# Patient Record
Sex: Female | Born: 2000 | Race: Black or African American | Hispanic: No | Marital: Single | Smoking: Never smoker
Health system: Southern US, Community
[De-identification: ages and names within clinical notes are randomized; demographics above are authoritative.]

## PROBLEM LIST (undated history)

## (undated) DIAGNOSIS — Z789 Other specified health status: Secondary | ICD-10-CM

## (undated) HISTORY — DX: Other specified health status: Z78.9

---
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
## 2018-01-01 ENCOUNTER — Ambulatory Visit: Payer: Medicaid Other | Admitting: Pediatrics

## 2018-03-17 ENCOUNTER — Ambulatory Visit: Payer: Commercial Managed Care - PPO | Admitting: Pediatrics

## 2018-05-26 ENCOUNTER — Ambulatory Visit: Payer: Commercial Managed Care - PPO | Admitting: Pediatrics

## 2018-05-26 ENCOUNTER — Encounter: Payer: Self-pay | Admitting: Pediatrics

## 2018-05-26 ENCOUNTER — Ambulatory Visit (INDEPENDENT_AMBULATORY_CARE_PROVIDER_SITE_OTHER): Payer: Commercial Managed Care - PPO | Admitting: Pediatrics

## 2018-05-26 ENCOUNTER — Other Ambulatory Visit: Payer: Self-pay

## 2018-05-26 VITALS — Wt 103.6 lb

## 2018-05-26 DIAGNOSIS — Z113 Encounter for screening for infections with a predominantly sexual mode of transmission: Secondary | ICD-10-CM

## 2018-05-26 DIAGNOSIS — Z1389 Encounter for screening for other disorder: Secondary | ICD-10-CM

## 2018-05-26 DIAGNOSIS — B9689 Other specified bacterial agents as the cause of diseases classified elsewhere: Secondary | ICD-10-CM

## 2018-05-26 DIAGNOSIS — R399 Unspecified symptoms and signs involving the genitourinary system: Secondary | ICD-10-CM

## 2018-05-26 DIAGNOSIS — Z3202 Encounter for pregnancy test, result negative: Secondary | ICD-10-CM | POA: Diagnosis not present

## 2018-05-26 DIAGNOSIS — N760 Acute vaginitis: Secondary | ICD-10-CM | POA: Diagnosis not present

## 2018-05-26 LAB — POCT URINALYSIS DIPSTICK
BILIRUBIN UA: NEGATIVE
Glucose, UA: NEGATIVE
KETONES UA: NEGATIVE
Nitrite, UA: NEGATIVE
PH UA: 6.5 (ref 5.0–8.0)
Protein, UA: NEGATIVE
RBC UA: NEGATIVE
Spec Grav, UA: 1.015 (ref 1.010–1.025)
UROBILINOGEN UA: NEGATIVE U/dL — AB

## 2018-05-26 LAB — POCT URINE PREGNANCY: PREG TEST UR: NEGATIVE

## 2018-05-26 MED ORDER — CEFTRIAXONE SODIUM 250 MG IJ SOLR
250.0000 mg | Freq: Once | INTRAMUSCULAR | Status: AC
  Administered 2018-05-26: 250 mg via INTRAMUSCULAR

## 2018-05-26 MED ORDER — AZITHROMYCIN 500 MG PO TABS
1000.0000 mg | ORAL_TABLET | Freq: Once | ORAL | Status: AC
  Administered 2018-05-26: 1000 mg via ORAL

## 2018-05-26 MED ORDER — FLUCONAZOLE 150 MG PO TABS: 150.0000 mg | ORAL_TABLET | Freq: Once | ORAL | 1 refills | Status: AC

## 2018-05-26 NOTE — Patient Instructions (Signed)
Gonorrhea Gonorrhea is a sexually transmitted disease (STD) that can affect both men and women. If left untreated, this infection can:  Damage the female or female organs.  Cause women and men to be unable to have children (be sterile).  Harm a fetus if an infected woman is pregnant. It is important to get treatment for gonorrhea as soon as possible. It is also necessary for all of your sexual partners to be tested for the infection. What are the causes? This condition is caused by bacteria called Neisseria gonorrhoeae. The infection is spread from person to person through sexual contact, including oral, anal, and vaginal sex. A newborn can contract the infection from his or her mother during birth. What increases the risk? The following factors may make you more likely to develop this condition:  Being a woman who is younger than 20 years of age and who is sexually active.  Being a woman 25 years of age or older who has: ? A new sex partner. ? More than one sex partner. ? A sex partner who has an STD.  Being a man who has: ? A new sex partner. ? More than one sex partner. ? A sex partner who has an STD.  Using condoms inconsistently.  Currently having, or having previously had, an STD.  Exchanging sex for money or drugs. What are the signs or symptoms? Some people do not have any symptoms. If you do have symptoms, they may be different for females and males. For females  Pain in the lower abdomen.  Abnormal vaginal discharge. The discharge may be cloudy, thick, or yellow-green in color.  Bleeding between periods.  Painful sex.  Burning or itching in and around the vagina.  Pain or burning when urinating.  Irritation, pain, bleeding, or discharge from the rectum. This may occur if the infection was spread by anal sex.  Sore throat or swollen lymph nodes in the neck. This may occur if the infection was spread by oral sex. For males  Abnormal discharge from the penis.  This discharge may be cloudy, thick, or yellow-green in color.  Pain or burning during urination.  Pain or swelling in the testicles.  Irritation, pain, bleeding, or discharge from the rectum. This may occur if the infection was spread by anal sex.  Sore throat, fever, or swollen lymph nodes in the neck. This may occur if the infection was spread by oral sex. How is this diagnosed? This condition is diagnosed based on:  A physical exam.  A sample of discharge that is examined under a microscope to look for the bacteria. The discharge may be taken from the urethra, cervix, throat, or rectum.  Urine tests. Not all of test results will be available during your visit. How is this treated? This condition is treated with antibiotic medicines. It is important for treatment to begin as soon as possible. Early treatment may prevent some problems from developing. Do not have sex during treatment. Avoid all types of sexual activity for 7 days after treatment is complete and until any sex partners have been treated. Follow these instructions at home:  Take over-the-counter and prescription medicines only as told by your health care provider.  Take your antibiotic medicine as told by your health care provider. Do not stop taking the antibiotic even if you start to feel better.  Do not have sex until at least 7 days after you and your partner(s) have finished treatment and your health care provider says it is okay.    It is your responsibility to get your test results. Ask your health care provider, or the department performing the test, when your results will be ready.  If you test positive for gonorrhea, inform your recent sexual partners. This includes any oral, anal, or vaginal sex partners. They need to be checked for gonorrhea even if they do not have symptoms. They may need treatment, even if they test negative for gonorrhea.  Keep all follow-up visits as told by your health care provider.  This is important. How is this prevented?   Use latex condoms correctly every time you have sexual intercourse.  Ask if your sexual partner has been tested for STDs and had negative results.  Avoid having multiple sexual partners. Contact a health care provider if:  You develop a bad reaction to the medicine you were prescribed. This may include: ? A rash. ? Nausea. ? Vomiting. ? Diarrhea.  Your symptoms do not get better after a few days of taking antibiotics.  Your symptoms get worse.  You develop new symptoms.  Your pain gets worse.  You have a fever.  You develop pain, itching, or discharge around the eyes. Get help right away if:  You feel dizzy or faint.  You have trouble breathing or have shortness of breath.  You develop an irregular heartbeat.  You have severe abdominal pain with or without shoulder pain.  You develop any bumps or sores (lesions) on your skin.  You develop warmth, redness, pain, or swelling around your joints, such as the knee. Summary  Gonorrhea is an STDthat can affect both men and women.  This condition is caused by bacteria called Neisseria gonorrhoeae. The infection is spread from person to person, usually through sexual contact, including oral, anal, and vaginal sex.  Symptoms vary between males and females. Generally, they include abnormal discharge and burning during urination. Women may also experience painful sex, itching around the vagina, and bleeding between menstrual periods. Men may also experience swelling of the testicles.  This condition is treated with antibiotic medicines. Do not have sex until at least 7 days after completing antibiotic treatment.  If left untreated, gonorrhea can have serious side effects and complications. This information is not intended to replace advice given to you by your health care provider. Make sure you discuss any questions you have with your health care provider. Document Released:  05/04/2000 Document Revised: 01/24/2018 Document Reviewed: 04/06/2016 Elsevier Interactive Patient Education  2019 [employer] Inc. Vaginal Yeast infection, Adult  Vaginal yeast infection is a condition that causes vaginal discharge as well as soreness, swelling, and redness (inflammation) of the vagina. This is a common condition. Some women get this infection frequently. What are the causes? This condition is caused by a change in the normal balance of the yeast (candida) and bacteria that live in the vagina. This change causes an overgrowth of yeast, which causes the inflammation. What increases the risk? The condition is more likely to develop in women who:  Take antibiotic medicines.  Have diabetes.  Take birth control pills.  Are pregnant.  Douche often.  Have a weak body defense system (immune system).  Have been taking steroid medicines for a long time.  Frequently wear tight clothing. What are the signs or symptoms? Symptoms of this condition include:  White, thick, creamy vaginal discharge.  Swelling, itching, redness, and irritation of the vagina. The lips of the vagina (vulva) may be affected as well.  Pain or a burning feeling while urinating.  Pain during  sex. How is this diagnosed? This condition is diagnosed based on:  Your medical history.  A physical exam.  A pelvic exam. Your health care provider will examine a sample of your vaginal discharge under a microscope. Your health care provider may send this sample for testing to confirm the diagnosis. How is this treated? This condition is treated with medicine. Medicines may be over-the-counter or prescription. You may be told to use one or more of the following:  Medicine that is taken by mouth (orally).  Medicine that is applied as a cream (topically).  Medicine that is inserted directly into the vagina (suppository). Follow these instructions at home:  Lifestyle  Do not have sex until your health  care provider approves. Tell your sex partner that you have a yeast infection. That person should go to his or her health care provider and ask if they should also be treated.  Do not wear tight clothes, such as pantyhose or tight pants.  Wear breathable cotton underwear. General instructions  Take or apply over-the-counter and prescription medicines only as told by your health care provider.  Eat more yogurt. This may help to keep your yeast infection from returning.  Do not use tampons until your health care provider approves.  Try taking a sitz bath to help with discomfort. This is a warm water bath that is taken while you are sitting down. The water should only come up to your hips and should cover your buttocks. Do this 3-4 times per day or as told by your health care provider.  Do not douche.  If you have diabetes, keep your blood sugar levels under control.  Keep all follow-up visits as told by your health care provider. This is important. Contact a health care provider if:  You have a fever.  Your symptoms go away and then return.  Your symptoms do not get better with treatment.  Your symptoms get worse.  You have new symptoms.  You develop blisters in or around your vagina.  You have blood coming from your vagina and it is not your menstrual period.  You develop pain in your abdomen. Summary  Vaginal yeast infection is a condition that causes discharge as well as soreness, swelling, and redness (inflammation) of the vagina.  This condition is treated with medicine. Medicines may be over-the-counter or prescription.  Take or apply over-the-counter and prescription medicines only as told by your health care provider.  Do not douche. Do not have sex or use tampons until your health care provider approves.  Contact a health care provider if your symptoms do not get better with treatment or your symptoms go away and then return. This information is not intended to  replace advice given to you by your health care provider. Make sure you discuss any questions you have with your health care provider. Document Released: 02/14/2005 Document Revised: 09/23/2017 Document Reviewed: 09/23/2017 Elsevier Interactive Patient Education  2019 Elsevier Inc. Chlamydia, Female  Chlamydia is a STD (sexually transmitted disease). This is an infection that spreads through sexual contact. If it is not treated, it can cause serious problems. It must be treated with antibiotic medicine. If this infection is not treated and you are pregnant or become pregnant, your baby could get it during delivery. This may cause bad health problems for the baby. Sometimes, you may not have symptoms (asymptomatic). When you have symptoms, they can include:  Burning when you pee (urinate).  Peeing often.  Fluid (discharge) coming from the vagina.  Redness, soreness, and swelling (inflammation) of the butt (rectum).  Bleeding or fluid coming from the butt.  Belly (abdominal) pain.  Pain during sex.  Bleeding between periods.  Itching, burning, or redness in the eyes.  Fluid coming from the eyes. Follow these instructions at home: Medicines  Take over-the-counter and prescription medicines only as told by your doctor.  Take your antibiotic medicine as told by your doctor. Do not stop taking the antibiotic even if you start to feel better. Sexual activity  Tell sex partners about your infection. Sex partners are people you had oral, anal, or vaginal sex with within 60 days of when you started getting sick. They need treatment, too.  Do not have sex until: ? You and your sex partners have been treated. ? Your doctor says it is okay.  If you have a single dose treatment, wait 7 days before having sex. General instructions  It is up to you to get your test results. Ask your doctor when your results will be ready.  Get a lot of rest.  Eat healthy foods.  Drink enough fluid  to keep your pee (urine) clear or pale yellow.  Keep all follow-up visits as told by your doctor. You may need tests after 3 months. Preventing chlamydia  The only way to prevent chlamydia is not to have sex. To lower your risk: ? Use latex condoms correctly. Do this every time you have sex. ? Avoid having many sex partners. ? Ask if your partner has been tested for STDs and if he or she had negative results. Contact a doctor if:  You get new symptoms.  You do not get better with treatment.  You have a fever or chills.  You have pain during sex. Get help right away if:  Your pain gets worse and does not get better with medicine.  You get flu-like symptoms, such as: ? Night sweats. ? Sore throat. ? Muscle aches.  You feel sick to your stomach (nauseous).  You throw up (vomit).  You have trouble swallowing.  You have bleeding: ? Between periods. ? After sex.  You have irregular periods.  You have belly pain that does not get better with medicine.  You have lower back pain that does not get better with medicine.  You feel weak or dizzy.  You pass out (faint).  You are pregnant and you get symptoms of chlamydia. Summary  Chlamydia is an infection that spreads through sexual contact.  Sometimes, chlamydia can cause no symptoms (asymptomatic).  Do not have sex until your doctor says it is okay.  All sex partners will have to be treated for chlamydia. This information is not intended to replace advice given to you by your health care provider. Make sure you discuss any questions you have with your health care provider. Document Released: 02/14/2008 Document Revised: 10/29/2017 Document Reviewed: 04/26/2016 Elsevier Interactive Patient Education  2019 [employer].

## 2018-05-26 NOTE — Progress Notes (Signed)
Subjective:    Susan Hammond is a 18 y.o. old female here with her self for Vaginal Discharge (itching and odor poss lesions.) and Acne (concerns about face)     HPI Chief Complaint  Patient presents with  . Vaginal Discharge    itching and odor poss lesions.  . Acne    concerns about face   17yo with vaginal discharge.  She has had itching and burning pain x 1wk.  She has had white/creamy color w/ malodor. She also noticed a few non-vesicular bumps, but they resolved within 2d of appearing.  Pt is sexually active with one female only, but unsure how many partners he has had.  Last encounter was 2wks ago, did not use a condom. LMP Dec 7.    Review of Systems  Genitourinary: Positive for vaginal discharge and vaginal pain. Negative for difficulty urinating, dysuria and vaginal bleeding.    History and Problem List: Susan Hammond does not have a problem list on file.  Susan Hammond  has no past medical history on file.  Immunizations needed: none     Objective:    Wt 103 lb 9.6 oz (47 kg)   BMI 18.95 kg/m  Physical Exam Constitutional:      Appearance: She is well-developed.  HENT:     Right Ear: External ear normal.     Left Ear: External ear normal.     Nose: Nose normal.  Eyes:     Pupils: Pupils are equal, round, and reactive to light.  Neck:     Musculoskeletal: Normal range of motion.  Cardiovascular:     Rate and Rhythm: Normal rate and regular rhythm.     Heart sounds: Normal heart sounds.  Pulmonary:     Effort: Pulmonary effort is normal.     Breath sounds: Normal breath sounds.  Abdominal:     General: Bowel sounds are normal.     Palpations: Abdomen is soft.  Musculoskeletal: Normal range of motion.  Skin:    Capillary Refill: Capillary refill takes less than 2 seconds.  Neurological:     Mental Status: She is alert.        Assessment and Plan:   [name] is a 18 y.o. old female with  1. Screening for genitourinary condition  - POCT urinalysis dipstick -  Urine Culture  2. Routine screening for STI (sexually transmitted infection)  - C. trachomatis/N. gonorrhoeae RNA - WET PREP BY MOLECULAR PROBE - azithromycin (ZITHROMAX) tablet 1,000 mg - cefTRIAXone (ROCEPHIN) injection 250 mg  3. Negative pregnancy test  - POCT urine pregnancy  4. Urinary tract infection symptoms  - fluconazole (DIFLUCAN) 150 MG tablet; Take 1 tablet (150 mg total) by mouth once for 1 dose.  Dispense: 1 tablet; Refill: 1    No follow-ups on file.  Daiva Huge, MD

## 2018-05-27 LAB — URINE CULTURE
MICRO NUMBER:: 15816
RESULT: NO GROWTH
SPECIMEN QUALITY:: ADEQUATE

## 2018-05-27 LAB — WET PREP BY MOLECULAR PROBE
Candida species: DETECTED — AB
MICRO NUMBER: 15819
SPECIMEN QUALITY: ADEQUATE
TRICHOMONAS VAG: NOT DETECTED

## 2018-05-27 LAB — C. TRACHOMATIS/N. GONORRHOEAE RNA
C. trachomatis RNA, TMA: NOT DETECTED
N. gonorrhoeae RNA, TMA: NOT DETECTED

## 2018-05-27 MED ORDER — METRONIDAZOLE 500 MG PO TABS: 500.0000 mg | ORAL_TABLET | Freq: Two times a day (BID) | ORAL | 0 refills | Status: AC

## 2018-05-27 NOTE — Addendum Note (Signed)
Addended by: Daiva Huge on: 05/27/2018 04:40 PM   Modules accepted: Orders

## 2018-06-19 ENCOUNTER — Telehealth: Payer: Self-pay | Admitting: *Deleted

## 2018-06-19 NOTE — Telephone Encounter (Signed)
Caller requesting clarification of medicines. Continues with symptoms as at last visit but has not taken fluconazole and just started flagyl yesterday. (was on PCN for oral surgery and was afraid to take both together.)  Advised her to continue flagyl and get fluconazole as well. Will call back if symptoms fail to improve.

## 2018-06-20 DIAGNOSIS — S0512XA Contusion of eyeball and orbital tissues, left eye, initial encounter: Secondary | ICD-10-CM | POA: Diagnosis not present

## 2018-06-20 DIAGNOSIS — H538 Other visual disturbances: Secondary | ICD-10-CM | POA: Diagnosis not present

## 2018-06-27 DIAGNOSIS — H5213 Myopia, bilateral: Secondary | ICD-10-CM | POA: Diagnosis not present

## 2018-07-30 DIAGNOSIS — H5213 Myopia, bilateral: Secondary | ICD-10-CM | POA: Diagnosis not present

## 2018-07-30 DIAGNOSIS — H52223 Regular astigmatism, bilateral: Secondary | ICD-10-CM | POA: Diagnosis not present

## 2018-11-13 ENCOUNTER — Ambulatory Visit (INDEPENDENT_AMBULATORY_CARE_PROVIDER_SITE_OTHER): Payer: Medicaid Other | Admitting: Pediatrics

## 2018-11-13 ENCOUNTER — Other Ambulatory Visit: Payer: Self-pay

## 2018-11-13 ENCOUNTER — Encounter: Payer: Self-pay | Admitting: Pediatrics

## 2018-11-13 VITALS — Temp 98.9°F | Wt 103.6 lb

## 2018-11-13 DIAGNOSIS — Z3202 Encounter for pregnancy test, result negative: Secondary | ICD-10-CM | POA: Diagnosis not present

## 2018-11-13 DIAGNOSIS — R300 Dysuria: Secondary | ICD-10-CM | POA: Diagnosis not present

## 2018-11-13 LAB — POCT URINE PREGNANCY: Preg Test, Ur: NEGATIVE

## 2018-11-13 LAB — POCT URINALYSIS DIPSTICK
Bilirubin, UA: NEGATIVE
Blood, UA: NEGATIVE
Glucose, UA: NEGATIVE
Ketones, UA: NEGATIVE
Leukocytes, UA: NEGATIVE
Nitrite, UA: NEGATIVE
Protein, UA: POSITIVE — AB
Spec Grav, UA: 1.025 (ref 1.010–1.025)
Urobilinogen, UA: 0.2 E.U./dL
pH, UA: 5 (ref 5.0–8.0)

## 2018-11-13 LAB — POCT RAPID HIV: Rapid HIV, POC: NEGATIVE

## 2018-11-13 MED ORDER — AZITHROMYCIN 500 MG PO TABS
1000.0000 mg | ORAL_TABLET | Freq: Once | ORAL | Status: AC
  Administered 2018-11-13: 1000 mg via ORAL

## 2018-11-13 NOTE — Progress Notes (Signed)
   Subjective:     Susan Hammond, is a 18 y.o. female  HPI  Chief Complaint  Patient presents with  . Back Pain    x 1 week   . Abdominal Pain    x1 week   . other    patient has a history of UTI ; she is having more discharge than normal    07/2017: chlamydia pos UTI 10/2017--seen in [facility], urine cult not diagnostic 05/2018:  Vag discharge, with itching and burning Wet prep:  gardnerella and candida : no Chlamydia or GC  Currently: No vomiting No diarrhea No fever Dysuria--no Frequency--no Menses finished about 5 day ago No change in appetite and UOP  No new partner since January, same partner  Not always condoms,  No contraception  Discharge--no smell, no itch, no chunks, no change of color, just more  Last RPR 04/2017 HIV last 12 2018   Review of Systems   The following portions of the patient's history were reviewed and updated as appropriate: allergies, current medications, past family history, past medical history, past social history, past surgical history and problem list.  History and Problem List: Susan Hammond does not have a problem list on file.  Susan Hammond  has no past medical history on file.     Objective:     Temp 98.9 F (37.2 C) (Oral)   Wt 103 lb 9.6 oz (47 kg)   BMI 18.95 kg/m   Physical Exam Constitutional:      General: She is not in acute distress. HENT:     Head: Normocephalic and atraumatic.     Right Ear: External ear normal.     Left Ear: External ear normal.     Nose: Nose normal.     Mouth/Throat:     Mouth: Mucous membranes are moist.     Pharynx: Oropharynx is clear. No oropharyngeal exudate.  Eyes:     General:        Right eye: No discharge.        Left eye: No discharge.     Conjunctiva/sclera: Conjunctivae normal.  Neck:     Musculoskeletal: Normal range of motion.  Cardiovascular:     Rate and Rhythm: Normal rate and regular rhythm.     Heart sounds: Normal heart sounds.  Pulmonary:     Effort: No  respiratory distress.     Breath sounds: No wheezing or rales.  Abdominal:     General: Abdomen is flat. There is no distension.     Palpations: Abdomen is soft.     Tenderness: There is no abdominal tenderness.  Skin:    General: Skin is warm and dry.     Findings: No rash.        Assessment & Plan:   1. Dysuria  Unprotected sexual activity with same partner UA neg unlikely UTI  - C. trachomatis/N. gonorrhoeae RNA - WET PREP BY MOLECULAR PROBE - POCT urinalysis dipstick - POCT urine pregnancy - POCT Rapid HIV--neg - azithromycin (ZITHROMAX) tablet 1,000 mg  Declined PRP and IM ceftriaxone--will accept treatment if positive  Declined offered condoms  Phone [phone] preferred phone  Supportive care and return precautions reviewed.  Spent  25  minutes face to face time with patient; greater than 50% spent in counseling regarding diagnosis and treatment plan.   Roselind Messier, MD

## 2018-11-14 LAB — WET PREP BY MOLECULAR PROBE
Candida species: NOT DETECTED
Gardnerella vaginalis: NOT DETECTED
MICRO NUMBER:: 607964
SPECIMEN QUALITY:: ADEQUATE
Trichomonas vaginosis: NOT DETECTED

## 2018-11-14 LAB — C. TRACHOMATIS/N. GONORRHOEAE RNA
C. trachomatis RNA, TMA: NOT DETECTED
N. gonorrhoeae RNA, TMA: NOT DETECTED

## 2018-11-14 NOTE — Progress Notes (Signed)
No answer no VM available 

## 2018-11-17 NOTE — Progress Notes (Signed)
Attempt to contact patient. No answer or VM.

## 2018-11-18 NOTE — Progress Notes (Signed)
Unable to contact patient. Letter generated and sent to home.

## 2018-12-23 ENCOUNTER — Ambulatory Visit (INDEPENDENT_AMBULATORY_CARE_PROVIDER_SITE_OTHER): Payer: Medicaid Other | Admitting: Pediatrics

## 2018-12-23 ENCOUNTER — Other Ambulatory Visit: Payer: Self-pay

## 2018-12-23 ENCOUNTER — Encounter: Payer: Self-pay | Admitting: Pediatrics

## 2018-12-23 VITALS — Temp 97.4°F | Wt 108.8 lb

## 2018-12-23 DIAGNOSIS — N949 Unspecified condition associated with female genital organs and menstrual cycle: Secondary | ICD-10-CM

## 2018-12-23 DIAGNOSIS — Z3202 Encounter for pregnancy test, result negative: Secondary | ICD-10-CM

## 2018-12-23 DIAGNOSIS — Z7251 High risk heterosexual behavior: Secondary | ICD-10-CM

## 2018-12-23 LAB — POCT URINE PREGNANCY: Preg Test, Ur: NEGATIVE

## 2018-12-23 NOTE — Patient Instructions (Signed)
@  CurDate@ @PATIENTFIRSTNAME @ @PATIENTLASTNAME @ 08-03[birth year]98 [id number]   Dear Susan Hammond,  As your medical provider, it is important to me that you continue to receive high-quality primary care services as you transition to adulthood.  After the age of 81, you can no longer be seen at the [facility] for your primary care health services.   Below is a list of adult medicine practices that are currently accepting new patients.  Please reach out to one of these practices to schedule a new patient appointment as soon as possible.  Please be aware that you will not be able to be seen at my office after your 20nd birthday.  Sincerely, Murlean Hark, MD  [facility]    Adult [place] Name Criteria Services   [facility]  Address: [place], [place] [address]  Phone: [phone] Hours: Monday - Friday 9 AM -6 PM  Types of insurance accepted:  Marland Kitchen Pharmacist, community . [place] (orange card) . Medicaid . Medicare . Uninsured  Language services:  Marland Kitchen Video and phone interpreters available   Ages 56 and older    . Adult primary care . Onsite pharmacy . Integrated behavioral health . Financial assistance counseling . Walk-in hours for established patients  Financial assistance counseling hours: Tuesdays 2:00PM - 5:00PM  Thursday 8:30AM - 4:30PM  Space is limited, 10 on Tuesday and 20 on Thursday on a first come, first serve basis  Name [facility]  Address: [address] [place], [place] [address]  Phone: [phone]  Hours: Monday - Friday 8:30 AM - 5 PM  Types of insurance accepted:  Marland Kitchen Pharmacist, community . Medicaid . Medicare . Uninsured  Language services:  Marland Kitchen Video and phone interpreters available   All ages - newborn to adult    . Primary care for all ages (children and adults) . Integrated behavioral health . Nutritionist . Financial assistance counseling   Name [facility] on the ground floor of [facility]  Address: [address],  [place]  [address]  Phone: [phone]  Hours: Monday - Friday 8:15 AM - 5 PM  Types of insurance accepted:  Marland Kitchen Pharmacist, community . Medicaid . Medicare . Uninsured  Language services:  Marland Kitchen Video and phone interpreters available   Ages 77 and older   . Adult primary care . Nutritionist . Certified Diabetes Educator  . Integrated behavioral health . Financial assistance counseling   Name [facility]  Address: [address] [place], [place] [address]  Phone: [phone]  Hours: Monday - Friday 8:30 AM - 5 PM    Types of insurance accepted:  Marland Kitchen Pharmacist, community . Medicaid . Medicare . Uninsured  Language services:  Marland Kitchen Video and phone interpreters available   All ages - newborn to adult   . Primary care for all ages (children and adults) . Integrated behavioral health . Financial assistance counseling

## 2018-12-23 NOTE — Progress Notes (Signed)
PCP: Paulene Floor, MD   CC: spotting and wanted to know what her labs showed last visit    History was provided by the mother.   Subjective:  HPI:  Susan Hammond is a 18 y.o. female Here for follow up from visit over a month ago - seen in clinic 6/25 for back pain, abdominal pain and vaginal discharge.  UA was negative at that time. Given Azithromycin, but did not want the IM ceftriaxone while labs were pending- but agreed to return to get treatment if labs negative Lab results: HIV - negative Wet prep- negative N. Gonorrhoeae and C. Trachomatis negative POC preg test negative  Last [facility] was 2017  Last week starting having some vaginal irritation and had a pump that was a little painful (looked like a pimple)- had recently shaved and then decided to not shave for a few days  Last wed used OTC yeast for vaginal discomfort Spotting last Thurs- Sat-intermittent- pink in color, did not feel like it was her period and had some pain with sexual intercourse on Sat  Continues to be sexually active and not using protection.  States that she does not want to get pregnant, but does not want birth control  REVIEW OF SYSTEMS: 10 systems reviewed and negative except as per HPI  Meds: Current Outpatient Medications  Medication Sig Dispense Refill  . acetaminophen (TYLENOL) 325 MG tablet Take 2 tablets (650 mg total) by mouth every 6 (six) hours as needed. (Patient not taking: Reported on 05/26/2018) 30 tablet 0  . ibuprofen (ADVIL,MOTRIN) 400 MG tablet Take 1 tablet (400 mg total) by mouth every 6 (six) hours as needed. (Patient not taking: Reported on 05/26/2018) 30 tablet 0  . methocarbamol (ROBAXIN) 750 MG tablet Take 1-2 tablets (750-1,500 mg total) by mouth 3 (three) times daily as needed for muscle spasms. (Patient not taking: Reported on 05/26/2018) 18 tablet 0   No current facility-administered medications for this visit.     ALLERGIES: No Known Allergies  PMH: No past medical  history on file.  Problem List: There are no active problems to display for this patient.  PSH: No past surgical history on file.  Social history:  Social History   Social History Narrative  . Not on file    Family history: No family history on file.   Objective:   Physical Examination:  Temp: (!) 97.4 F (36.3 C) (Temporal) Wt: 108 lb 12.8 oz (49.4 kg)  BMI: Body mass index is 19.9 kg/m. (Facility age limit for growth percentiles is 20 years.) GENERAL: Well appearing, no distress HEENT: NCAT,  no nasal discharge, MMM LUNGS: normal WOB, CTAB, no wheeze, no crackles CARDIO: RR, normal S1S2 no murmur, well perfused ABDOMEN: Normoactive bowel sounds, soft, ND/NT, no masses or organomegaly GU: Normal appearing female, healing lesion right labia that appears to be healing ingrown hair, no discharge   Assessment:  Susan Hammond is a 18 y.o. old female here for intermittent spotting last week in the setting of continued unprotected sexual intercourse   Plan:   1. Unprotected sex -no discharge currently -pending Urine GC/Chlam/Wet prep due to vaginal discomfort and patient worry -POC preg test negative -counseled on high risk of pregnancy with not using protection- offered BC and offered fu apt re birth control, but patient declined at this time  2. Transition of care -given list of pcps for transition to adult care    Follow up: Return if symptoms worsen or fail to improve.   Murlean Hark,  MD [facility] 12/23/2018  2:39 PM

## 2018-12-24 LAB — WET PREP BY MOLECULAR PROBE
Candida species: NOT DETECTED
Gardnerella vaginalis: NOT DETECTED
MICRO NUMBER:: 736156
SPECIMEN QUALITY:: ADEQUATE
Trichomonas vaginosis: NOT DETECTED

## 2018-12-24 LAB — C. TRACHOMATIS/N. GONORRHOEAE RNA
C. trachomatis RNA, TMA: DETECTED — AB
N. gonorrhoeae RNA, TMA: NOT DETECTED

## 2018-12-29 ENCOUNTER — Telehealth: Payer: Self-pay | Admitting: Pediatrics

## 2018-12-29 MED ORDER — AZITHROMYCIN 500 MG PO TABS: ORAL_TABLET | ORAL | 0 refills | Status: DC

## 2018-12-29 NOTE — Telephone Encounter (Signed)
Reportable disease form started; will hold in green pod RN folder for treatment information.

## 2018-12-29 NOTE — Telephone Encounter (Signed)
Positive urine chlamydia- called patient to notify- no answer- left message that the patient needs to return call to clinic for results.  Will need to be treated with Azithromycin 1g PO and partner(s) will also need to be treated.  Will ask Clinic RN To attempt to call patient again and to notify health dept.  Azithromycin sent to pharmacy listed in [place], but if patient prefers then she can come to clinic for medication to be given.  Need to continue to advise condoms and birth control (patient previously declined, but states that she does not want to be pregnant) Murlean Hark MD

## 2018-12-29 NOTE — Telephone Encounter (Signed)
I called [phone] and left message on generic VM asking patient to call [place] for lab results and message from provider.

## 2018-12-30 ENCOUNTER — Other Ambulatory Visit: Payer: Self-pay

## 2018-12-30 ENCOUNTER — Ambulatory Visit (INDEPENDENT_AMBULATORY_CARE_PROVIDER_SITE_OTHER): Payer: Medicaid Other | Admitting: Pediatrics

## 2018-12-30 DIAGNOSIS — A749 Chlamydial infection, unspecified: Secondary | ICD-10-CM

## 2018-12-30 MED ORDER — AZITHROMYCIN 500 MG PO TABS
1000.0000 mg | ORAL_TABLET | Freq: Once | ORAL | Status: AC
  Administered 2018-12-30: 1000 mg via ORAL

## 2018-12-30 NOTE — Telephone Encounter (Signed)
I spoke with Susan Hammond and relayed message from Dr. Tamera Punt; scheduled onsite visit today 3:30 pm for treatment/partner treatment. [name] ended the call before I could complete prescreening questions.

## 2019-01-01 NOTE — Progress Notes (Signed)
History was provided by the patient.  Susan Hammond is a 18 y.o. female who is here for directly observed treatment after a positive chlamydia test.     HPI:  She was seen here on 8/4 for unprotected sex and STI testing. Had some vaginal irritation then but not other symptoms. CHalmydia test resulted positive, GC negative. POC grenancy tets and wet prep negative.  She currently is asymptomatic. No abdominal pain or vaginal discharge  The following portions of the patient's history were reviewed and updated as appropriate: past family history, past medical history, past surgical history and problem list.  Physical Exam:  There were no vitals taken for this visit.  Growth percentile SmartLinks can only be used for patients less than 51 years old.  No LMP recorded.    General:   alert and cooperative  Abdomen:  soft, non-tender; bowel sounds normal; no masses,  no organomegaly    Assessment/Plan: Azithromycin 1000 mg po x 1 (2 tabs) Expedited partner therapy pack given to [facility] with instructions to give to partner I offered counseling on birth control but [name] declined at this time. Discussed importance of condoms. We discussed transitioning to an adult provider and list given  Return in 3 months for retest  Susan Odea, MD  01/01/19

## 2019-03-11 ENCOUNTER — Other Ambulatory Visit: Payer: Self-pay

## 2019-03-11 ENCOUNTER — Other Ambulatory Visit (HOSPITAL_COMMUNITY)
Admission: RE | Admit: 2019-03-11 | Discharge: 2019-03-11 | Disposition: A | Discharge status: Home / Self Care | Payer: Commercial Managed Care - PPO | Source: Ambulatory Visit | Attending: Pediatrics | Admitting: Pediatrics

## 2019-03-11 ENCOUNTER — Encounter: Payer: Self-pay | Admitting: Pediatrics

## 2019-03-11 ENCOUNTER — Ambulatory Visit (INDEPENDENT_AMBULATORY_CARE_PROVIDER_SITE_OTHER): Payer: Medicaid Other | Admitting: Pediatrics

## 2019-03-11 DIAGNOSIS — R109 Unspecified abdominal pain: Secondary | ICD-10-CM | POA: Diagnosis not present

## 2019-03-11 DIAGNOSIS — N926 Irregular menstruation, unspecified: Secondary | ICD-10-CM | POA: Insufficient documentation

## 2019-03-11 DIAGNOSIS — Z3202 Encounter for pregnancy test, result negative: Secondary | ICD-10-CM | POA: Diagnosis not present

## 2019-03-11 LAB — POCT URINE PREGNANCY: Preg Test, Ur: NEGATIVE

## 2019-03-11 MED ORDER — AZITHROMYCIN 500 MG PO TABS
1000.0000 mg | ORAL_TABLET | Freq: Once | ORAL | Status: AC
  Administered 2019-03-11: 1000 mg via ORAL

## 2019-03-11 NOTE — Progress Notes (Signed)
Virtual Visit via Video Note  I connected with Susan Hammond  on 03/11/19 at  3:30 PM EDT by a video enabled telemedicine application and verified that I am speaking with the correct person using two identifiers.   Location of patient/parent: Home   I discussed the limitations of evaluation and management by telemedicine and the availability of in person appointments.  I discussed that the purpose of this telehealth visit is to provide medical care while limiting exposure to the novel coronavirus.  The patient expressed understanding and agreed to proceed.  Reason for visit:  Chief Complaint  Patient presents with  . Abdominal Pain    SPOTTING SINCE YESTERDAY     History of Present Illness:  Spotting since yesterday. LMP: 02/23/2019-cycle and lasted for 5 days and was as usual.  Patient reports that she usually has regular cycles every 30 days does not usually have spotting in between her cycles.  She is also been having some vague periumbilical abdominal pain but no nausea or vomiting.  No diarrhea or constipation. She also has some clear vaginal discharge.  No history of any dyspareunia or any dysuria.  History of chlamydia infection 2 months ago and was treated with azithromycin.  Partner was also treated.  patient's last unprotected sexual intercourse was 4 weeks ago.  She is not on any birth control and she and her partner also do not use condoms.  Observations/Objective: Well-appearing patient on self palpation of abdomen there was no tenderness to palpation.  Assessment and Plan: 18 year old female with irregular vaginal bleeding abdominal pain Need to rule out pregnancy and STI. Advised patient to come into clinic to give [place] a urine sample and orders placed for point-of-care pregnancy test, urine GC/Chlamydia and wet prep. Also to provide patient treatment for presumptive chlamydia with azithromycin 1000 mg and to give her partner therapy. We will call back with results.  Also  discussed need for condoms to prevent STIs and options for contraception.  Patient declined any options for contraception right now.  Follow Up Instructions:    I discussed the assessment and treatment plan with the patient and/or parent/guardian. They were provided an opportunity to ask questions and all were answered. They agreed with the plan and demonstrated an understanding of the instructions.   They were advised to call back or seek an in-person evaluation in the emergency room if the symptoms worsen or if the condition fails to improve as anticipated.  I spent 20 minutes on this telehealth visit inclusive of face-to-face video and care coordination time I was located at [facility] during this encounter.  Ok Edwards, MD

## 2019-03-12 LAB — WET PREP BY MOLECULAR PROBE
Candida species: NOT DETECTED
MICRO NUMBER:: 1014085
SPECIMEN QUALITY:: ADEQUATE
Trichomonas vaginosis: NOT DETECTED

## 2019-03-13 LAB — URINE CYTOLOGY ANCILLARY ONLY
Chlamydia: NEGATIVE
Comment: NORMAL
Neisseria Gonorrhea: NEGATIVE

## 2019-03-18 ENCOUNTER — Other Ambulatory Visit: Payer: Self-pay | Admitting: Pediatrics

## 2019-03-18 ENCOUNTER — Telehealth: Payer: Self-pay

## 2019-03-18 DIAGNOSIS — N760 Acute vaginitis: Secondary | ICD-10-CM

## 2019-03-18 DIAGNOSIS — B9689 Other specified bacterial agents as the cause of diseases classified elsewhere: Secondary | ICD-10-CM

## 2019-03-18 MED ORDER — METRONIDAZOLE 500 MG PO TABS: 500.0000 mg | ORAL_TABLET | Freq: Two times a day (BID) | ORAL | 0 refills | Status: AC

## 2019-03-18 NOTE — Telephone Encounter (Signed)
Called patient and notified of lab results and Dr. Ilda Basset message.

## 2019-03-18 NOTE — Telephone Encounter (Signed)
Please let patient know that her wet prep was positive for BV, so I have sent a script for Metronidazole 500 mg bid for 7 days. All other labs are negative. Her urine was negative for Chlamydia even though we treated her onsite for possible STI. Thanks!!  Claudean Kinds, MD [place] [facility] [place], [place] [address] Ph: [phone] Fax: [phone] 03/18/2019 1:20 PM

## 2019-03-18 NOTE — Telephone Encounter (Signed)
Susan Hammond left message on nurse line requesting lab results.

## 2019-03-19 ENCOUNTER — Telehealth: Payer: Self-pay

## 2019-03-19 NOTE — Telephone Encounter (Signed)
Susan Hammond left message on nurse line saying that pharmacy did not have RX. I called [facility] on [address] and was told that they do have RX but insurance may not cover cost ($16.03). I spoke with Susan Hammond and relayed this information. She says she is currently in [facility]; I recommended going to local [facility] and asking if RX can be transferred.

## 2019-03-28 IMAGING — DX DG CHEST 1V PORT
1 series · 1 of 1 positions shown · non-contrast
Comparison: 01/02/2016

CLINICAL DATA: Fever and malaise.

EXAM:
PORTABLE CHEST 1 VIEW

[chest]
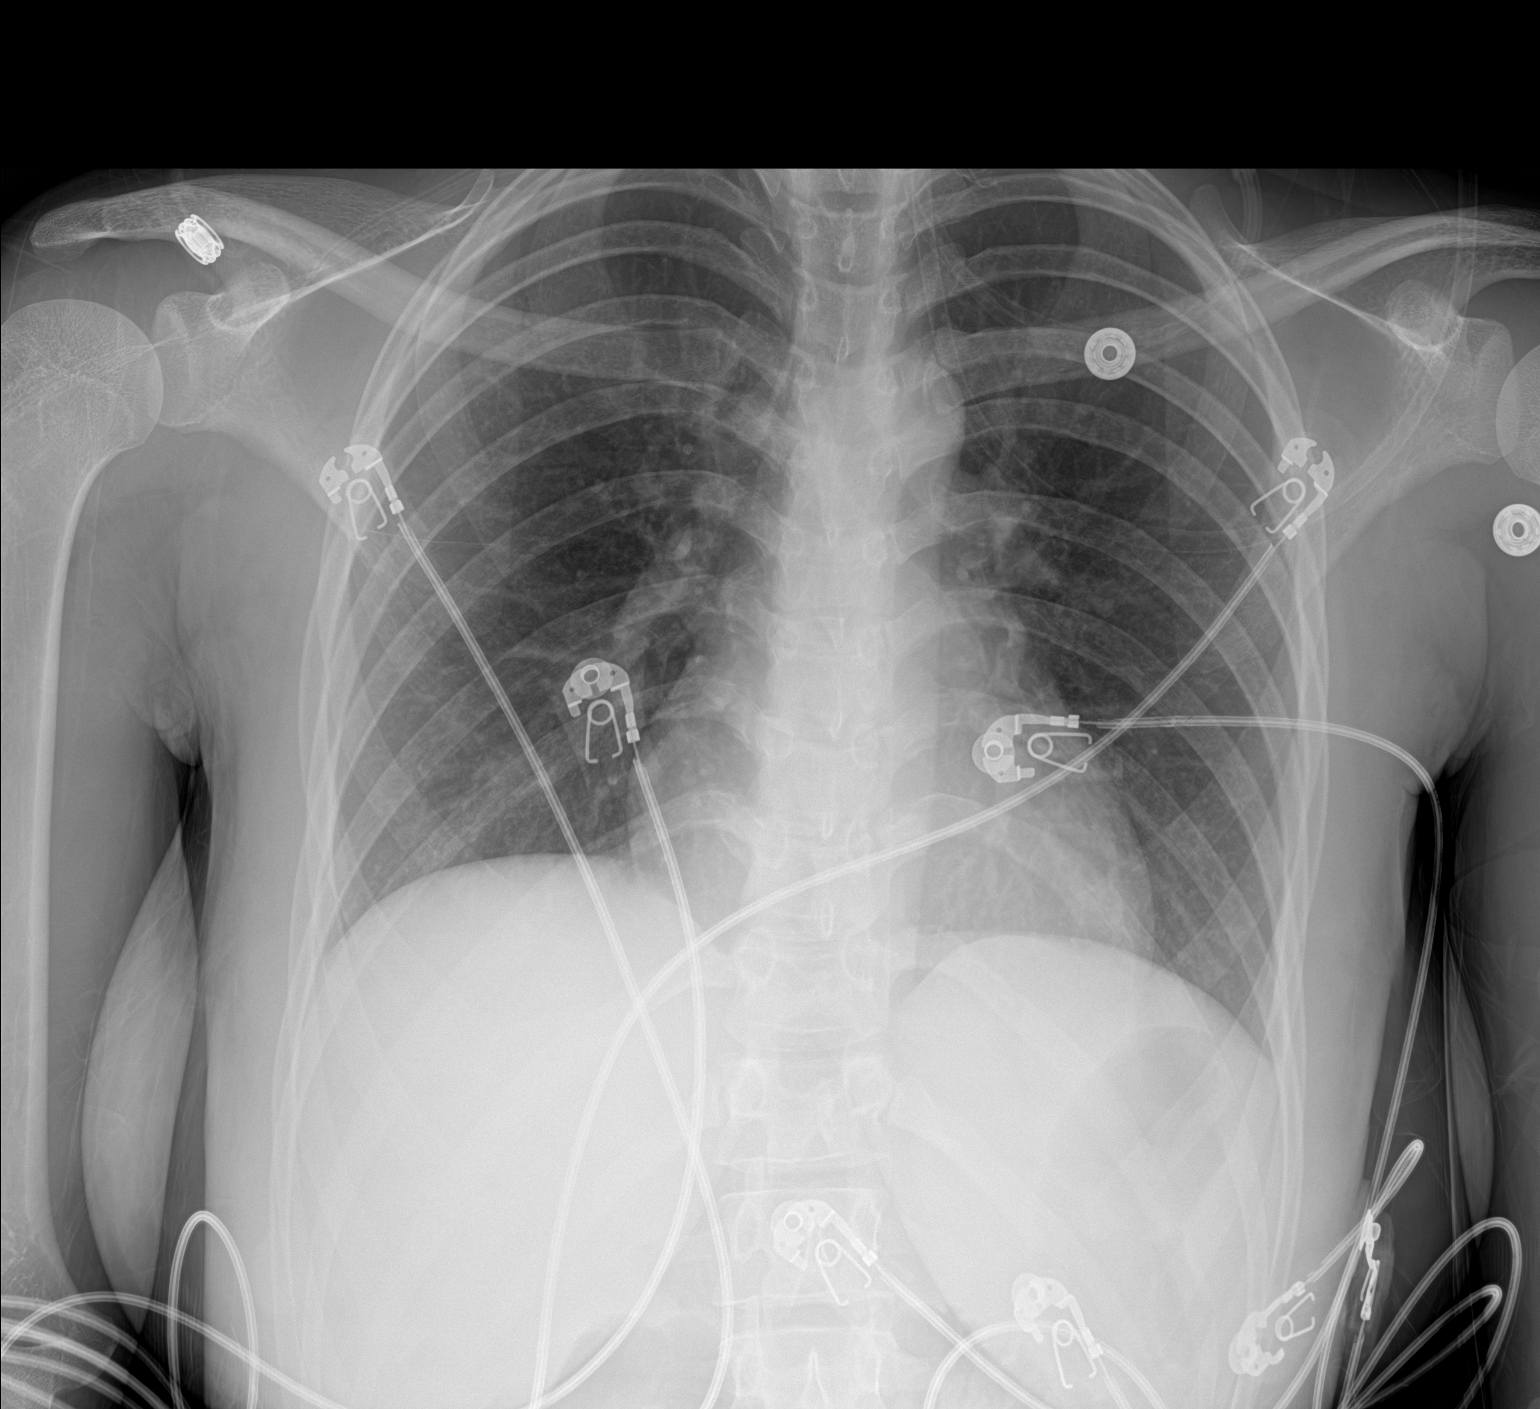

[1 of 1 positions shown; findings below may reference images not displayed]

FINDINGS: The heart size and mediastinal contours are within normal limits.
Both lungs are clear. The visualized skeletal structures are
unremarkable.
IMPRESSION: No active disease.

## 2019-04-10 ENCOUNTER — Ambulatory Visit (INDEPENDENT_AMBULATORY_CARE_PROVIDER_SITE_OTHER): Payer: Medicaid Other | Admitting: Pediatrics

## 2019-04-10 ENCOUNTER — Telehealth: Payer: Self-pay

## 2019-04-10 DIAGNOSIS — B9689 Other specified bacterial agents as the cause of diseases classified elsewhere: Secondary | ICD-10-CM | POA: Diagnosis not present

## 2019-04-10 DIAGNOSIS — N760 Acute vaginitis: Secondary | ICD-10-CM | POA: Diagnosis not present

## 2019-04-10 DIAGNOSIS — R109 Unspecified abdominal pain: Secondary | ICD-10-CM

## 2019-04-10 MED ORDER — METRONIDAZOLE 500 MG PO TABS: 500.0000 mg | ORAL_TABLET | Freq: Two times a day (BID) | ORAL | 0 refills | Status: AC
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
## 2019-06-12 ENCOUNTER — Ambulatory Visit: Payer: Medicaid Other | Admitting: Pediatrics

## 2019-06-16 ENCOUNTER — Ambulatory Visit: Payer: Medicaid Other | Admitting: Pediatrics

## 2019-06-16 NOTE — Progress Notes (Deleted)
PCP: Paulene Floor, MD   CC:  CC   History was provided by the {[email or web address]}.   Subjective:  HPI:  Susan Hammond is a 19 y.o. female with a history of previous chlamydia 5 months ago and BV 3 months ago.  Chlamydia was treated in clinic and she had a negative test after treatment.  BV prescription was sent    REVIEW OF SYSTEMS: 10 systems reviewed and negative except as per HPI  Meds: Current Outpatient Medications  Medication Sig Dispense Refill  . acetaminophen (TYLENOL) 325 MG tablet Take 2 tablets (650 mg total) by mouth every 6 (six) hours as needed. (Patient not taking: Reported on 05/26/2018) 30 tablet 0  . ibuprofen (ADVIL,MOTRIN) 400 MG tablet Take 1 tablet (400 mg total) by mouth every 6 (six) hours as needed. (Patient not taking: Reported on 05/26/2018) 30 tablet 0   No current facility-administered medications for this visit.    ALLERGIES: No Known Allergies  PMH: No past medical history on file.  Problem List: There are no problems to display for this patient.  PSH: No past surgical history on file.  Social history:  Social History   Social History Narrative  . Not on file    Family history: No family history on file.   Objective:   Physical Examination:  Temp:   Pulse:   BP:   (Growth percentile SmartLinks can only be used for patients less than 25 years old.)  Wt:    Ht:    BMI: There is no height or weight on file to calculate BMI. (Facility age limit for growth percentiles is 20 years.) GENERAL: Well appearing, no distress HEENT: NCAT, clear sclerae, TMs normal bilaterally, no nasal discharge, no tonsillary erythema or exudate, MMM NECK: Supple, no cervical LAD LUNGS: normal WOB, CTAB, no wheeze, no crackles CARDIO: RR, normal S1S2 no murmur, well perfused ABDOMEN: Normoactive bowel sounds, soft, ND/NT, no masses or organomegaly GU: Normal *** EXTREMITIES: Warm and well perfused, no deformity NEURO: Awake, alert, interactive, normal  strength, tone, sensation, and gait.  SKIN: No rash, ecchymosis or petechiae     Assessment:  Susan Hammond is a 19 y.o. old female here for ***   Plan:   1. ***   Immunizations today: ***  Follow up: No follow-ups on file.   Murlean Hark, MD [facility] 06/16/2019  11:00 AM

## 2019-12-03 ENCOUNTER — Other Ambulatory Visit: Payer: Self-pay

## 2019-12-03 ENCOUNTER — Ambulatory Visit (INDEPENDENT_AMBULATORY_CARE_PROVIDER_SITE_OTHER): Payer: Medicaid Other | Admitting: Family

## 2019-12-03 ENCOUNTER — Other Ambulatory Visit (HOSPITAL_COMMUNITY)
Admission: RE | Admit: 2019-12-03 | Discharge: 2019-12-03 | Disposition: A | Discharge status: Home / Self Care | Payer: Medicaid Other | Source: Ambulatory Visit | Attending: Family | Admitting: Family

## 2019-12-03 ENCOUNTER — Encounter: Payer: Self-pay | Admitting: Family

## 2019-12-03 VITALS — BP 110/64 | HR 78 | Ht 62.21 in | Wt 102.0 lb

## 2019-12-03 DIAGNOSIS — N926 Irregular menstruation, unspecified: Secondary | ICD-10-CM | POA: Diagnosis not present

## 2019-12-03 DIAGNOSIS — L700 Acne vulgaris: Secondary | ICD-10-CM

## 2019-12-03 DIAGNOSIS — Z3202 Encounter for pregnancy test, result negative: Secondary | ICD-10-CM | POA: Diagnosis not present

## 2019-12-03 DIAGNOSIS — Z113 Encounter for screening for infections with a predominantly sexual mode of transmission: Secondary | ICD-10-CM | POA: Diagnosis not present

## 2019-12-03 DIAGNOSIS — L680 Hirsutism: Secondary | ICD-10-CM

## 2019-12-03 NOTE — Patient Instructions (Signed)
We will call you with results from today's labs.

## 2019-12-04 ENCOUNTER — Other Ambulatory Visit: Payer: Self-pay | Admitting: Family

## 2019-12-04 LAB — URINE CYTOLOGY ANCILLARY ONLY
Bacterial Vaginitis-Urine: NEGATIVE
Candida Urine: NEGATIVE
Chlamydia: NEGATIVE
Comment: NEGATIVE
Comment: NEGATIVE
Comment: NORMAL
Neisseria Gonorrhea: NEGATIVE
Trichomonas: NEGATIVE

## 2019-12-04 LAB — WET PREP BY MOLECULAR PROBE
Candida species: NOT DETECTED
MICRO NUMBER:: 10709425
SPECIMEN QUALITY:: ADEQUATE
Trichomonas vaginosis: NOT DETECTED

## 2019-12-04 MED ORDER — METRONIDAZOLE 500 MG PO TABS
500.0000 mg | ORAL_TABLET | Freq: Two times a day (BID) | ORAL | 0 refills | Status: DC
Start: 2019-12-04 — End: 2020-09-11

## 2019-12-04 NOTE — Progress Notes (Signed)
History was provided by the patient.  Susan Hammond is a 19 y.o. female who is here for dysmenorrhea.   PCP confirmed? Yes.    Paulene Floor, MD  HPI:   -last month period was extremely painful, vomited first day  -was on depo x one shot; BTB during depo window  -has dark facial hair -back acne -mom had PCOS -LMP ended last Wednesday  -yesterday noticed brown discharge - no smell, no itch -had same issue last month after her cycle  -no lesions, no dyspareunia  -no abdominal pain, no pelvic pain   Review of Systems  Constitutional: Negative for chills, fever and malaise/fatigue.  HENT: Negative for sore throat.   Eyes: Negative for blurred vision and pain.  Respiratory: Negative for shortness of breath.   Cardiovascular: Negative for chest pain and palpitations.  Gastrointestinal: Negative for abdominal pain and nausea.  Genitourinary: Negative for dysuria and hematuria.  Musculoskeletal: Negative for myalgias and neck pain.  Skin: Negative for rash.  Neurological: Negative for dizziness and headaches.  Psychiatric/Behavioral: Negative for depression and suicidal ideas. The patient is not nervous/anxious.      There are no problems to display for this patient.   Current Outpatient Medications on File Prior to Visit  Medication Sig Dispense Refill   acetaminophen (TYLENOL) 325 MG tablet Take 2 tablets (650 mg total) by mouth every 6 (six) hours as needed. (Patient not taking: Reported on 05/26/2018) 30 tablet 0   ibuprofen (ADVIL,MOTRIN) 400 MG tablet Take 1 tablet (400 mg total) by mouth every 6 (six) hours as needed. (Patient not taking: Reported on 05/26/2018) 30 tablet 0   No current facility-administered medications on file prior to visit.    No Known Allergies  Physical Exam:    Vitals:   12/03/19 1050  BP: 110/64  Pulse: 78  Weight: 102 lb (46.3 kg)  Height: 5' 2.21" (1.58 m)    Growth percentile SmartLinks can only be used for patients less than  32 years old. No LMP recorded.  Physical Exam Vitals reviewed. Exam conducted with a chaperone present Colletta Maryland R, CMA ).  Constitutional:      Appearance: Normal appearance. She is not toxic-appearing.  HENT:     Head: Normocephalic.     Mouth/Throat:     Mouth: Mucous membranes are moist.  Eyes:     General: No scleral icterus.    Extraocular Movements: Extraocular movements intact.     Pupils: Pupils are equal, round, and reactive to light.  Cardiovascular:     Rate and Rhythm: Normal rate and regular rhythm.     Heart sounds: No murmur heard.   Pulmonary:     Effort: Pulmonary effort is normal.  Abdominal:     General: Abdomen is flat.  Genitourinary:    Vagina: Normal.     Cervix: No discharge.     Uterus: Normal.      Adnexa: Right adnexa normal and left adnexa normal.  Musculoskeletal:        General: No swelling. Normal range of motion.     Cervical back: Normal range of motion and neck supple.  Lymphadenopathy:     Cervical: No cervical adenopathy.     Lower Body: No right inguinal adenopathy. No left inguinal adenopathy.  Skin:    General: Skin is warm and dry.     Findings: No rash.  Neurological:     General: No focal deficit present.     Mental Status: She is alert and oriented  to person, place, and time.  Psychiatric:        Mood and Affect: Mood normal.      Assessment/Plan: 1. Irregular periods 2. Hirsutism 3. Acne vulgaris  19 yo A/I female presents with dysmenorrhea, irregular periods, hirsutism and back acne questioning if she has PCOS. She has been on depo provera in 2017 but no contraception since that time. Will assess endocrine labs today and revisit contraception conversation.   Last Depo ( 02/15/2016) Last gc/c negative 45/36/4680  - Follicle stimulating hormone - 17-Hydroxyprogesterone; Future - CBC; Future - Comprehensive metabolic panel; Future - DHEA-sulfate; Future - Luteinizing hormone; Future - Prolactin; Future - T4,  free; Future - Testos,Total,Free and SHBG (Female); Future - TSH; Future   4. Routine screening for STI (sexually transmitted infection) - Urine cytology ancillary only - WET PREP BY MOLECULAR PROBE  5. Pregnancy examination or test, negative result -negative - POCT urine pregnancy

## 2019-12-21 ENCOUNTER — Telehealth: Payer: Self-pay | Admitting: Pediatrics

## 2019-12-21 NOTE — Telephone Encounter (Signed)
Call was from Dr. Tamera Punt notifying Philippa Sicks that she should transition to adult caregiver; recommended [facility]. I spoke with Dalilah and relayed this information and phone number; she is due for PE.

## 2019-12-21 NOTE — Telephone Encounter (Signed)
Patient called and stated she received a call but the call did not go all the way through. She said to have whomever called to please call back.

## 2020-02-19 ENCOUNTER — Other Ambulatory Visit: Payer: Self-pay

## 2020-02-19 ENCOUNTER — Ambulatory Visit (INDEPENDENT_AMBULATORY_CARE_PROVIDER_SITE_OTHER): Payer: Medicaid Other | Admitting: Student in an Organized Health Care Education/Training Program

## 2020-02-19 ENCOUNTER — Encounter: Payer: Self-pay | Admitting: Student in an Organized Health Care Education/Training Program

## 2020-02-19 DIAGNOSIS — R4689 Other symptoms and signs involving appearance and behavior: Secondary | ICD-10-CM | POA: Diagnosis not present

## 2020-02-19 DIAGNOSIS — Z7251 High risk heterosexual behavior: Secondary | ICD-10-CM | POA: Diagnosis not present

## 2020-02-19 NOTE — Progress Notes (Signed)
    SUBJECTIVE:   CHIEF COMPLAINT / HPI: establish care, chest wall lump  Lump on chest wall- becomes larger during periods. Non-painful. Non-erythematous. Only seen when laying down. In right breast.  No family history of breast cancer. Aunt passed away about age 21/56 from unknown cancer. Uncle passed away from unknown cancer in early 74s. No skin changes. No nipples discharges, no breast size changes. Has been present between 1-2 years unchanged.  Periods regular. Not on birth control. Sexually active with female partners. No concerns for STDs. Does not want to discuss birth control. Not actively trying to get pregnant. Never had pap smear. Declines today. No h/o HPV vaccine.   Denies, tobacco, alcohol, illicit drug use.   Denies depression, anxiety.   Not very active.  Poor diet. Drinks a lot of water.   OBJECTIVE:   BP 104/62   Pulse (!) 102   Ht 5\' 2"  (1.575 m)   Wt 108 lb (49 kg)   SpO2 99%   BMI 19.75 kg/m   General: NAD, pleasant, able to participate in exam. Exam room smells of marijuana. Breast: bilateral breast exam revealed dense fibrous tissue without tenderness, redness, skin changes, nipple discharge. Extremities: no edema or cyanosis. WWP. Skin: warm and dry, no rashes noted Neuro: alert and oriented x4, no focal deficits Psych: Normal affect and mood  ASSESSMENT/PLAN:   Concern about appearance of breast Examined patient's breasts with normal dense fibrous breast tissue and symmetrical without other concerning features in exam or history. - provided patient education and reassurance - f/u in one year to re-examine or sooner if having changes  High risk sexual behavior Patient high risk for unplanned pregnancy - offered to discuss birth control but she declined - offered STD screening but she declined - recommended pap smear but she declined. Agreed to come back at another time for pap smear and will consider HPV vaccine.  - provided patient with  education.      [place]

## 2020-02-19 NOTE — Patient Instructions (Signed)
It was a pleasure to see you today!  To summarize our discussion for this visit:  You had a normal breast exam. We should recheck your breast tissue at your next physical in 1 year. Let me know if you have any more concerns before that.  You are due for a pap smear. Please make an appointment to come in for that at your earliest convenience and we can discuss the HPV vaccine as well.  Let me know if you'd like to discuss birth control options.   Some additional health maintenance measures we should update are: Health Maintenance Due  Topic Date Due   Hepatitis C Screening  Never done   TETANUS/TDAP  Never done   PAP-Cervical Cytology Screening  Never done   PAP SMEAR-Modifier  Never done   INFLUENZA VACCINE  12/20/2019   CHLAMYDIA SCREENING  12/23/2019     Call the clinic at [phone] if your symptoms worsen or you have any concerns.   Thank you for allowing me to take part in your care,  Dr. Doristine Mango

## 2020-02-22 DIAGNOSIS — R4689 Other symptoms and signs involving appearance and behavior: Secondary | ICD-10-CM | POA: Insufficient documentation

## 2020-02-22 DIAGNOSIS — Z7251 High risk heterosexual behavior: Secondary | ICD-10-CM | POA: Insufficient documentation

## 2020-02-22 NOTE — Assessment & Plan Note (Signed)
Examined patient's breasts with normal dense fibrous breast tissue and symmetrical without other concerning features in exam or history. - provided patient education and reassurance - f/u in one year to re-examine or sooner if having changes

## 2020-02-22 NOTE — Assessment & Plan Note (Signed)
Patient high risk for unplanned pregnancy - offered to discuss birth control but she declined - offered STD screening but she declined - recommended pap smear but she declined. Agreed to come back at another time for pap smear and will consider HPV vaccine.  - provided patient with education.

## 2020-03-04 ENCOUNTER — Ambulatory Visit: Payer: Medicaid Other | Admitting: Student in an Organized Health Care Education/Training Program

## 2020-03-15 ENCOUNTER — Other Ambulatory Visit: Payer: Self-pay

## 2020-03-15 ENCOUNTER — Ambulatory Visit (INDEPENDENT_AMBULATORY_CARE_PROVIDER_SITE_OTHER): Payer: Medicaid Other | Admitting: Student in an Organized Health Care Education/Training Program

## 2020-03-15 ENCOUNTER — Other Ambulatory Visit (HOSPITAL_COMMUNITY)
Admission: RE | Admit: 2020-03-15 | Discharge: 2020-03-15 | Disposition: A | Discharge status: Home / Self Care | Payer: Medicaid Other | Source: Ambulatory Visit | Attending: Family Medicine | Admitting: Family Medicine

## 2020-03-15 ENCOUNTER — Encounter: Payer: Self-pay | Admitting: Student in an Organized Health Care Education/Training Program

## 2020-03-15 VITALS — BP 102/60 | HR 66 | Ht 62.0 in | Wt 109.8 lb

## 2020-03-15 DIAGNOSIS — Z124 Encounter for screening for malignant neoplasm of cervix: Secondary | ICD-10-CM | POA: Diagnosis not present

## 2020-03-15 DIAGNOSIS — Z1159 Encounter for screening for other viral diseases: Secondary | ICD-10-CM

## 2020-03-15 DIAGNOSIS — Z7251 High risk heterosexual behavior: Secondary | ICD-10-CM | POA: Diagnosis not present

## 2020-03-15 DIAGNOSIS — Z113 Encounter for screening for infections with a predominantly sexual mode of transmission: Secondary | ICD-10-CM | POA: Diagnosis not present

## 2020-03-15 DIAGNOSIS — Z2821 Immunization not carried out because of patient refusal: Secondary | ICD-10-CM | POA: Diagnosis not present

## 2020-03-15 LAB — POCT WET PREP (WET MOUNT)
Clue Cells Wet Prep Whiff POC: NEGATIVE
Trichomonas Wet Prep HPF POC: ABSENT

## 2020-03-15 NOTE — Assessment & Plan Note (Addendum)
Pap smear with wet prep and STI screening completed today. Cervix with right tilt and friable.

## 2020-03-15 NOTE — Patient Instructions (Signed)
It was a pleasure to see you today!  To summarize our discussion for this visit:  We completed a pap smear today and also tested for STIs. I will contact you with the results.  Please activate mychart as this is a great tool for communication with me  Please consider getting HPV vaccine, and COVID vaccine. They can save a life! COVID-19 Vaccine Information can be found at: ShippingScam.co.uk For questions related to vaccine distribution or appointments, please email [email or web address] or call [phone].     Some additional health maintenance measures we should update are: Health Maintenance Due  Topic Date Due   Hepatitis C Screening  Never done   TETANUS/TDAP  Never done   PAP-Cervical Cytology Screening  Never done   PAP SMEAR-Modifier  Never done   INFLUENZA VACCINE  12/20/2019   CHLAMYDIA SCREENING  12/23/2019     Call the clinic at [phone] if your symptoms worsen or you have any concerns.   Thank you for allowing me to take part in your care,  Dr. Doristine Mango   Pap Test Why am I having this test? A Pap test, also called a Pap smear, is a screening test to check for signs of:  Cancer of the vagina, cervix, and uterus. The cervix is the lower part of the uterus that opens into the vagina.  Infection.  Changes that may be a sign that cancer is developing (precancerous changes). Women need this test on a regular basis. In general, you should have a Pap test every 3 years until you reach menopause or age 32. Women aged 30-60 may choose to have their Pap test done at the same time as an HPV (human papillomavirus) test every 5 years (instead of every 3 years). Your health care provider may recommend having Pap tests more or less often depending on your medical conditions and past Pap test results. What kind of sample is taken?  Your health care provider will collect a sample of cells from the  surface of your cervix. This will be done using a small cotton swab, plastic spatula, or brush. This sample is often collected during a pelvic exam, when you are lying on your back on an exam table with feet in footrests (stirrups). In some cases, fluids (secretions) from the cervix or vagina may also be collected. How do I prepare for this test?  Be aware of where you are in your menstrual cycle. If you are menstruating on the day of the test, you may be asked to reschedule.  You may need to reschedule if you have a known vaginal infection on the day of the test.  Follow instructions from your health care provider about: ? Changing or stopping your regular medicines. Some medicines can cause abnormal test results, such as digitalis and tetracycline. ? Avoiding douching or taking a bath the day before or the day of the test. Tell a health care provider about:  Any allergies you have.  All medicines you are taking, including vitamins, herbs, eye drops, creams, and over-the-counter medicines.  Any blood disorders you have.  Any surgeries you have had.  Any medical conditions you have.  Whether you are pregnant or may be pregnant. How are the results reported? Your test results will be reported as either abnormal or normal. A false-positive result can occur. A false positive is incorrect because it means that a condition is present when it is not. A false-negative result can occur. A false negative is incorrect because it means  that a condition is not present when it is. What do the results mean? A normal test result means that you do not have signs of cancer of the vagina, cervix, or uterus. An abnormal result may mean that you have:  Cancer. A Pap test by itself is not enough to diagnose cancer. You will have more tests done in this case.  Precancerous changes in your vagina, cervix, or uterus.  Inflammation of the cervix.  An STD (sexually transmitted disease).  A fungal  infection.  A parasite infection. Talk with your health care provider about what your results mean. Questions to ask your health care provider Ask your health care provider, or the department that is doing the test:  When will my results be ready?  How will I get my results?  What are my treatment options?  What other tests do I need?  What are my next steps? Summary  In general, women should have a Pap test every 3 years until they reach menopause or age 79.  Your health care provider will collect a sample of cells from the surface of your cervix. This will be done using a small cotton swab, plastic spatula, or brush.  In some cases, fluids (secretions) from the cervix or vagina may also be collected. This information is not intended to replace advice given to you by your health care provider. Make sure you discuss any questions you have with your health care provider. Document Revised: 01/14/2017 Document Reviewed: 01/14/2017 Elsevier Patient Education  [place].

## 2020-03-15 NOTE — Progress Notes (Signed)
   SUBJECTIVE:   CHIEF COMPLAINT / HPI: pap smear, STI screening  covid and flu offered and declined HPV counseling today  Cervical cancer screening- never done before. Not complaining of current concerns/symptoms. Requesting STI screening.   OBJECTIVE:   BP 102/60   Pulse 66   Ht 5\' 2"  (1.575 m)   Wt 109 lb 12.8 oz (49.8 kg)   LMP 02/13/2020 (Exact Date)   SpO2 100%   BMI 20.08 kg/m   General: NAD, pleasant, able to participate in exam Pelvic:Cervix is angled from patients right and friable with scraping. Moderate mucous white discharge. Tolerated exam well.  Extremities: no edema. WWP. Skin: warm and dry, no rashes noted Neuro: alert and oriented, no focal deficits Psych: Normal affect and mood  ASSESSMENT/PLAN:   Vaccination declined by patient Recommended COVID and flu vaccine. Both declined.  Counseled on HPV vaccine again.   Cervical cancer screening Pap smear with wet prep and STI screening completed today. Cervix with right tilt and friable.      [place]

## 2020-03-15 NOTE — Assessment & Plan Note (Signed)
Recommended COVID and flu vaccine. Both declined.  Counseled on HPV vaccine again.

## 2020-03-16 LAB — HIV ANTIBODY (ROUTINE TESTING W REFLEX): HIV Screen 4th Generation wRfx: NONREACTIVE

## 2020-03-16 LAB — CYTOLOGY - PAP
Chlamydia: NEGATIVE
Comment: NEGATIVE
Comment: NORMAL
Diagnosis: NEGATIVE
Neisseria Gonorrhea: NEGATIVE

## 2020-03-16 LAB — RPR: RPR Ser Ql: NONREACTIVE

## 2020-03-16 LAB — HEPATITIS C ANTIBODY: Hep C Virus Ab: <0.1 s/co ratio (ref 0.0–0.9)

## 2020-08-01 ENCOUNTER — Other Ambulatory Visit: Payer: Self-pay | Admitting: Family

## 2020-08-04 ENCOUNTER — Other Ambulatory Visit: Payer: Self-pay | Admitting: *Deleted

## 2020-08-04 NOTE — Telephone Encounter (Signed)
Received voicemail on referral line asking that refill of metronidazole be sent to the pharmacy.  The request [facility] went [facility] and was denied.  Will forward to MD. [place] department

## 2020-09-08 NOTE — Patient Instructions (Signed)
Thank you for coming to see me today. It was a pleasure.   This is normal spotting.  You cervix is still shedding the uterine lining and this either gets absorbs by the body or in your case with the extra discharge gets expelled.  Keep a journal of the five days before ovulation to see if there is anything that triggers this.  Please follow-up with PCP as needed  If you have any questions or concerns, please do not hesitate to call the office at [phone].  Best,   Carollee Leitz, MD

## 2020-09-09 ENCOUNTER — Other Ambulatory Visit: Payer: Self-pay

## 2020-09-09 ENCOUNTER — Encounter: Payer: Self-pay | Admitting: Family Medicine

## 2020-09-09 ENCOUNTER — Ambulatory Visit (INDEPENDENT_AMBULATORY_CARE_PROVIDER_SITE_OTHER): Payer: Medicaid Other | Admitting: Family Medicine

## 2020-09-09 ENCOUNTER — Other Ambulatory Visit (HOSPITAL_COMMUNITY)
Admission: RE | Admit: 2020-09-09 | Discharge: 2020-09-09 | Disposition: A | Discharge status: Home / Self Care | Payer: Medicaid Other | Source: Ambulatory Visit | Attending: Family Medicine | Admitting: Family Medicine

## 2020-09-09 VITALS — BP 102/60 | HR 68 | Wt 112.2 lb

## 2020-09-09 DIAGNOSIS — N939 Abnormal uterine and vaginal bleeding, unspecified: Secondary | ICD-10-CM | POA: Diagnosis present

## 2020-09-09 DIAGNOSIS — N898 Other specified noninflammatory disorders of vagina: Secondary | ICD-10-CM

## 2020-09-09 LAB — POCT WET PREP (WET MOUNT)
Clue Cells Wet Prep Whiff POC: NEGATIVE
Trichomonas Wet Prep HPF POC: ABSENT

## 2020-09-09 LAB — POCT URINE PREGNANCY: Preg Test, Ur: NEGATIVE

## 2020-09-11 ENCOUNTER — Encounter: Payer: Self-pay | Admitting: Family Medicine

## 2020-09-11 MED ORDER — METRONIDAZOLE 500 MG PO TABS: 500.0000 mg | ORAL_TABLET | Freq: Two times a day (BID) | ORAL | 0 refills | Status: DC

## 2020-09-11 NOTE — Progress Notes (Signed)
    SUBJECTIVE:   CHIEF COMPLAINT / HPI: spotting and wanting STI testing  Patient reports one day of spotting after monthly period. Started August 2021 but has been consistent since January 2022.  Typically starts on 14th of cycle when ovulating.  Notices brownish/bright red discharge after wiping.  Does not require use of sanitary napkins. She has regular monthly periods and has never missed.  She is sexually active with one female partner in the past 6 months.  No BCP or condom use. LMP 04/07 roughly.  Had sexual activity couple of nights ago.  No increase in exercise, decrease in weight or decrease in appetite.  Denies any chest pain, palpitations, shortness of breath.  Endorses facial hair and abdominal hair, reports women her family tend to have more hair and mom has PCOS.  Patient not actively trying to gain pregnancy but ok if happens.   PERTINENT  PMH / PSH:  Menses at age [age] STI x2  Last PAP wnl  OBJECTIVE:   BP 102/60   Pulse 68   Wt 112 lb 3.2 oz (50.9 kg)   LMP 08/21/2020   SpO2 98%   BMI 20.52 kg/m    General: Alert, no acute distress Cardio: Normal S1 and S2, RRR, no r/m/g Pulm: CTAB, normal work of breathing Abdomen: Bowel sounds normal. Abdomen soft and non-tender.  Pelvic Exam chaperoned by CMA Shari        External: normal female genitalia without lesions or masses        Vagina: normal without lesions or masses, bloody discharge noted in vaginal vault        Cervix: normal without lesions or masses, bloody discharge noted from cervical os         ASSESSMENT/PLAN:   Vaginal spotting Unclear etiology.  I suspect this happens after bumping against cervix during sexual intercourse and with ovulation the increase in cervical mucus causing it to be expelled so more visible when wiping.  I have ask to make not of any activities that may occur a few days before she see the spotting.   -UPT negative -Wet mount negative -GC/C negative -Discussed BCP, patient declines   -Discussed that if she is actively trying to et pregnant we can do work up for PCOS given her family history -Follow up with PCP as needed or if symptoms worses     Carollee Leitz, MD [place]

## 2020-09-12 ENCOUNTER — Encounter: Payer: Self-pay | Admitting: Family Medicine

## 2020-09-12 DIAGNOSIS — N939 Abnormal uterine and vaginal bleeding, unspecified: Secondary | ICD-10-CM | POA: Insufficient documentation

## 2020-09-12 LAB — CERVICOVAGINAL ANCILLARY ONLY
Chlamydia: NEGATIVE
Comment: NEGATIVE
Comment: NORMAL
Neisseria Gonorrhea: NEGATIVE

## 2020-09-12 NOTE — Assessment & Plan Note (Signed)
Unclear etiology.  I suspect this happens after bumping against cervix during sexual intercourse and with ovulation the increase in cervical mucus causing it to be expelled so more visible when wiping.  I have ask to make not of any activities that may occur a few days before she see the spotting.   -UPT negative -Wet mount negative -GC/C negative -Discussed BCP, patient declines  -Discussed that if she is actively trying to et pregnant we can do work up for PCOS given her family history -Follow up with PCP as needed or if symptoms worses

## 2021-02-02 ENCOUNTER — Other Ambulatory Visit: Payer: Self-pay

## 2021-02-02 ENCOUNTER — Ambulatory Visit (INDEPENDENT_AMBULATORY_CARE_PROVIDER_SITE_OTHER): Payer: Medicaid Other | Admitting: Family Medicine

## 2021-02-02 ENCOUNTER — Other Ambulatory Visit (HOSPITAL_COMMUNITY)
Admission: RE | Admit: 2021-02-02 | Discharge: 2021-02-02 | Disposition: A | Discharge status: Home / Self Care | Payer: Medicaid Other | Source: Ambulatory Visit | Attending: Family Medicine | Admitting: Family Medicine

## 2021-02-02 DIAGNOSIS — D170 Benign lipomatous neoplasm of skin and subcutaneous tissue of head, face and neck: Secondary | ICD-10-CM | POA: Diagnosis not present

## 2021-02-02 DIAGNOSIS — Z113 Encounter for screening for infections with a predominantly sexual mode of transmission: Secondary | ICD-10-CM | POA: Insufficient documentation

## 2021-02-02 DIAGNOSIS — D179 Benign lipomatous neoplasm, unspecified: Secondary | ICD-10-CM | POA: Insufficient documentation

## 2021-02-02 HISTORY — DX: Benign lipomatous neoplasm, unspecified: D17.9

## 2021-02-02 NOTE — Progress Notes (Signed)
    SUBJECTIVE:   CHIEF COMPLAINT / HPI:   Concern for sexually transmitted infection: Patient is a 20 y.o. female presenting for concern for sexually transmitted infection. Patient notes that she was having neck pain for the last couple days and found a bump on the back of her head.  She feels like the pain might be related to how she slept, however, states she went on the Internet and got concerned about HIV/AIDS.  The pain has been getting better on its own.  Describes it as a soreness.  She had unprotected intercourse on 8/24.  She had a normal menstrual period following this.  She is not on any birth control but is interested in learning more about the different options.  She has a history of chlamydia in the past.  She denies any vaginal discharge or odor.  Patient endorses 2 sexual partners over the past year. Patient is interested in blood work as well including syphilis and HIV screening   PERTINENT  PMH / [place]: Chlamydia  OBJECTIVE:   BP 123/64   Pulse 69   SpO2 100%    General: NAD, pleasant, polite, able to participate in exam HEENT: Normocephalic, atraumatic, small 0.5cm mobile and soft lipoma to left occiput, <0.5 cm lipoma to right occiput. Two 1cm left-anterior cervical lymph nodes, oropharynx clear without erythema or exudates Respiratory: Breathing comfortably on room air, no respiratory distress GU: Normal appearance of labia majora and minora, without lesions. Vagina tissue pink, moist, without lesions or abrasions. Cervix normal appearance, non-friable, there is some yellow discharge at vaginal vault.  Skin: warm and dry, no rashes noted Psych: Normal affect and mood  GU exam chaperoned by Marcelle Smiling, CMA  ASSESSMENT/PLAN:   Routine screening for STI (sexually transmitted infection) Patient presents for STI screening.  She was most concerned for HIV given what she thought were lymph nodes on the back of her head.  Reassured patient that what she felt was a  lipoma.  Regardless, she has had unprotected intercourse recently.  She had a normal period following this. Examination notable for some yellow discharge in vaginal vault. Cervix without abnormalities. She did have two cervical lymph nodes- likely incidental and benign, can be monitored over time.  -Counseled on birth control methods; patient was given a handout on different options and advised to schedule an appointment to discuss further -She is provided with condoms today -Discussed starting prenatal vitamin as she is at risk for pregnancy -Checking chlamydia, gonorrhea, trichomonas, HIV, RPR, hepatitis B today -She declined Yeast and BV testing today as she is asymptomatic  Lipoma Two small (<1cm) soft and mobile lumps consistent with lipomas felt on either side of occiput. Reassurance was provided today. No further treatment or testing indicated.     Sharion Settler, [place]

## 2021-02-02 NOTE — Patient Instructions (Signed)
It was wonderful to see you today.  Today we talked about:  -We are checking for chlamydia, gonorrhea, trichomonas, HIV, syphilis, and hepatitis B today.  I will let you know the results via MyChart.  We will treat as necessary. -The bump on the back of her head is likely a left lipoma.  This is a noncancerous fatty tumor.  No treatment is necessary. -I have attached information regarding several different birth control options below.  I would recommend that you use condoms for protection.  Also be important to take a prenatal vitamin as you are at risk for getting pregnant if you are unprotected. -Call our office and schedule an appointment if you are interested in the [place] or IUD.  Thank you for choosing [facility].   Please call [phone] with any questions about today's appointment.  Please be sure to schedule follow up at the front  desk before you leave today.   Sharion Settler, DO PGY-2 Family Medicine   Contraception Choices Contraception, also called birth control, refers to methods or devices that prevent pregnancy. Hormonal methods Contraceptive implant A contraceptive implant is a thin, plastic tube that contains a hormone that prevents pregnancy. It is different from an intrauterine device (IUD). It is inserted into the upper part of the arm by a health care provider. Implants can be effective for up to 3 years. Progestin-only injections Progestin-only injections are injections of progestin, a synthetic form of the hormone progesterone. They are given every 3 months by a health care provider. Birth control pills Birth control pills are pills that contain hormones that prevent pregnancy. They must be taken once a day, preferably at the same time each day. A prescription is needed to use this method of contraception. Birth control patch The birth control patch contains hormones that prevent pregnancy. It is placed on the skin and must be changed once  a week for three weeks and removed on the fourth week. A prescription is needed to use this method of contraception. Vaginal ring A vaginal ring contains hormones that prevent pregnancy. It is placed in the vagina for three weeks and removed on the fourth week. After that, the process is repeated with a new ring. A prescription is needed to use this method of contraception. Emergency contraceptive Emergency contraceptives prevent pregnancy after unprotected sex. They come in pill form and can be taken up to 5 days after sex. They work best the sooner they are taken after having sex. Most emergency contraceptives are available without a prescription. This method should not be used as your only form of birth control. Barrier methods Female condom A female condom is a thin sheath that is worn over the penis during sex. Condoms keep sperm from going inside a woman's body. They can be used with a sperm-killing substance (spermicide) to increase their effectiveness. They should be thrown away after one use. Female condom A female condom is a soft, loose-fitting sheath that is put into the vagina before sex. The condom keeps sperm from going inside a woman's body. They should be thrown away after one use. Diaphragm A diaphragm is a soft, dome-shaped barrier. It is inserted into the vagina before sex, along with a spermicide. The diaphragm blocks sperm from entering the uterus, and the spermicide kills sperm. A diaphragm should be left in the vagina for 6-8 hours after sex and removed within 24 hours. A diaphragm is prescribed and fitted by a health care provider. A diaphragm should be replaced  every 1-2 years, after giving birth, after gaining more than 15 lb (6.8 kg), and after pelvic surgery. Cervical cap A cervical cap is a round, soft latex or plastic cup that fits over the cervix. It is inserted into the vagina before sex, along with spermicide. It blocks sperm from entering the uterus. The cap should be  left in place for 6-8 hours after sex and removed within 48 hours. A cervical cap must be prescribed and fitted by a health care provider. It should be replaced every 2 years. Sponge A sponge is a soft, circular piece of polyurethane foam with spermicide in it. The sponge helps block sperm from entering the uterus, and the spermicide kills sperm. To use it, you make it wet and then insert it into the vagina. It should be inserted before sex, left in for at least 6 hours after sex, and removed and thrown away within 30 hours. Spermicides Spermicides are chemicals that kill or block sperm from entering the cervix and uterus. They can come as a cream, jelly, suppository, foam, or tablet. A spermicide should be inserted into the vagina with an applicator at least [id number] minutes before sex to allow time for it to work. The process must be repeated every time you have sex. Spermicides do not require a prescription. Intrauterine contraception Intrauterine device (IUD) An IUD is a T-shaped device that is put in a woman's uterus. There are two types: Hormone IUD.This type contains progestin, a synthetic form of the hormone progesterone. This type can stay in place for 3-5 years. Copper IUD.This type is wrapped in copper wire. It can stay in place for 10 years. Permanent methods of contraception Female tubal ligation In this method, a woman's fallopian tubes are sealed, tied, or blocked during surgery to prevent eggs from traveling to the uterus. Hysteroscopic sterilization In this method, a small, flexible insert is placed into each fallopian tube. The inserts cause scar tissue to form in the fallopian tubes and block them, so sperm cannot reach an egg. The procedure takes about 3 months to be effective. Another form of birth control must be used during those 3 months. Female sterilization This is a procedure to tie off the tubes that carry sperm (vasectomy). After the procedure, the man can still ejaculate  fluid (semen). Another form of birth control must be used for 3 months after the procedure. Natural planning methods Natural family planning In this method, a couple does not have sex on days when the woman could become pregnant. Calendar method In this method, the woman keeps track of the length of each menstrual cycle, identifies the days when pregnancy can happen, and does not have sex on those days. Ovulation method In this method, a couple avoids sex during ovulation. Symptothermal method This method involves not having sex during ovulation. The woman typically checks for ovulation by watching changes in her temperature and in the consistency of cervical mucus. Post-ovulation method In this method, a couple waits to have sex until after ovulation. Where to find more information Centers for Disease Control and Prevention: http://www.wolf.info/ Summary Contraception, also called birth control, refers to methods or devices that prevent pregnancy. Hormonal methods of contraception include implants, injections, pills, patches, vaginal rings, and emergency contraceptives. Barrier methods of contraception can include female condoms, female condoms, diaphragms, cervical caps, sponges, and spermicides. There are two types of IUDs (intrauterine devices). An IUD can be put in a woman's uterus to prevent pregnancy for 3-5 years. Permanent sterilization can be done  through a procedure for males and females. Natural family planning methods involve nothaving sex on days when the woman could become pregnant. This information is not intended to replace advice given to you by your health care provider. Make sure you discuss any questions you have with your health care provider. Document Revised: 10/12/2019 Document Reviewed: 10/12/2019 Elsevier Patient Education  [place].

## 2021-02-02 NOTE — Assessment & Plan Note (Addendum)
Patient presents for STI screening.  She was most concerned for HIV given what she thought were lymph nodes on the back of her head.  Reassured patient that what she felt was a lipoma.  Regardless, she has had unprotected intercourse recently.  She had a normal period following this. Examination notable for some yellow discharge in vaginal vault. Cervix without abnormalities. She did have two cervical lymph nodes- likely incidental and benign, can be monitored over time.  -Counseled on birth control methods; patient was given a handout on different options and advised to schedule an appointment to discuss further -She is provided with condoms today -Discussed starting prenatal vitamin as she is at risk for pregnancy -Checking chlamydia, gonorrhea, trichomonas, HIV, RPR, hepatitis B today -She declined Yeast and BV testing today as she is asymptomatic

## 2021-02-02 NOTE — Assessment & Plan Note (Addendum)
Two small (<1cm) soft and mobile lumps consistent with lipomas felt on either side of occiput. Reassurance was provided today. No further treatment or testing indicated.

## 2021-02-03 LAB — RPR: RPR Ser Ql: NONREACTIVE

## 2021-02-03 LAB — CERVICOVAGINAL ANCILLARY ONLY
Chlamydia: NEGATIVE
Comment: NEGATIVE
Comment: NEGATIVE
Comment: NORMAL
Neisseria Gonorrhea: NEGATIVE
Trichomonas: NEGATIVE

## 2021-02-03 LAB — HEPATITIS B SURFACE ANTIBODY,QUALITATIVE: Hep B Surface Ab, Qual: NONREACTIVE

## 2021-02-03 LAB — HEPATITIS B SURFACE ANTIGEN: Hepatitis B Surface Ag: NEGATIVE

## 2021-02-03 LAB — HIV ANTIBODY (ROUTINE TESTING W REFLEX): HIV Screen 4th Generation wRfx: NONREACTIVE

## 2021-10-24 ENCOUNTER — Encounter: Payer: Self-pay | Admitting: *Deleted

## 2022-02-27 ENCOUNTER — Telehealth: Payer: Self-pay

## 2022-02-27 NOTE — Telephone Encounter (Signed)
Patient calls nurse line requesting rx refill on metronidazole. This is not on current medication list. She reports that she has had intermittent foul smelling vaginal discharge for the last 1-2 months. She denies abdominal pain, back pain, dysuria, or blood in urine. She also denies new sexual partners.   I advised that she would need an appointment prior to this being sent in. Patient states that she is in [facility] at this time and does not know when she will be back in [place] again.   Patient asking to send message to provider. Advised that I would forward message to provider, however, she may need to be seen in UC for this concern and to receive medication. Patient verbalizes understanding.   Please advise.   Talbot Grumbling, RN

## 2022-03-02 NOTE — Telephone Encounter (Signed)
Returned call to patient. She did not answer, VM not set up.   If patient calls back, please advise that she will need appointment prior to receiving treatment.   Talbot Grumbling, RN

## 2022-03-12 ENCOUNTER — Other Ambulatory Visit (HOSPITAL_COMMUNITY)
Admission: RE | Admit: 2022-03-12 | Discharge: 2022-03-12 | Disposition: A | Discharge status: Home / Self Care | Payer: Medicaid Other | Source: Ambulatory Visit | Attending: Family Medicine | Admitting: Family Medicine

## 2022-03-12 ENCOUNTER — Ambulatory Visit (INDEPENDENT_AMBULATORY_CARE_PROVIDER_SITE_OTHER): Payer: Medicaid Other | Admitting: Family Medicine

## 2022-03-12 VITALS — BP 106/64 | HR 75 | Ht 62.0 in | Wt 113.0 lb

## 2022-03-12 DIAGNOSIS — N6314 Unspecified lump in the right breast, lower inner quadrant: Secondary | ICD-10-CM | POA: Diagnosis not present

## 2022-03-12 DIAGNOSIS — N898 Other specified noninflammatory disorders of vagina: Secondary | ICD-10-CM | POA: Insufficient documentation

## 2022-03-12 DIAGNOSIS — Z113 Encounter for screening for infections with a predominantly sexual mode of transmission: Secondary | ICD-10-CM

## 2022-03-12 LAB — POCT WET PREP (WET MOUNT)
Clue Cells Wet Prep Whiff POC: NEGATIVE
Trichomonas Wet Prep HPF POC: ABSENT

## 2022-03-12 NOTE — Patient Instructions (Addendum)
It was great to see you!  Your testing was negative for BV and yeast. I will send you a Mychart message with your STI test results.  bedsider.org is an excellent website regarding birth control options (pros, cons, patient stories, etc). Please contact us if you're interested in one of these methods.  You should take a daily prenatal vitamin if you are not using contraception.  We will get an ultrasound of your breast. They will call you to schedule.  Take care, Dr Rock Nephew

## 2022-03-12 NOTE — Progress Notes (Signed)
    SUBJECTIVE:   CHIEF COMPLAINT / HPI:   Vaginal Discharge -Patient reports pink vaginal discharge, tends to occur around ovulation -occasional odor but not persistent -no vaginal itching or urinary symptoms -she is wondering if it's BV -also requests STI testing -h/o chlamydia in the past (2019, 2020) -sexually active with 1 female partner -does not use anything for contraception -does not desire pregnancy, but states "if it happens it happens" -UTD on cervical cancer screening  Breast Lump -patient can feel a lump in her right breast -has been present for a few years -seems to be enlarging -not painful or symptomatic -no systemic symptoms   OBJECTIVE:   BP 106/64   Pulse 75   Ht '5\' 2"'$  (1.575 m)   Wt 113 lb (51.3 kg)   LMP 02/24/2022 (Exact Date)   SpO2 99%   BMI 20.67 kg/m   General: NAD, pleasant, able to participate in exam Respiratory: No respiratory distress Skin: warm and dry, no rashes noted Psych: Normal affect and mood Neuro: grossly intact GU/GYN: Exam performed in the presence of a chaperone. External genitalia within normal limits.  Vaginal mucosa pink, moist, normal rugae.  Nonfriable cervix without lesions, no discharge or bleeding noted on speculum exam.  Bimanual exam revealed normal, nongravid uterus.  No cervical motion tenderness. No adnexal masses bilaterally.  Breast: Exam performed in the presence of a chaperone. Approximately 2x2cm firm, moveable, nontender nodule in medial right breast at ~4 o'clock position. No other palpable masses. No skin changes.  ASSESSMENT/PLAN:   Vaginal Discharge Wet prep negative for BV, yeast, trich. Suspect this is physiologic in the setting of ovulation. GC/chlamydia, HIV, RPR pending. Encouraged daily prenatal vitamin use vs contraception use. She will contact us if interested in contraception.  Mass of lower inner quadrant of right breast Enlarging over few years. Physical exam suggestive of lipoma. Less likely  fibrocystic changes. Very low suspicion for malignancy. Will obtain ultrasound for further characterization.   Alcus Dad, MD [place]

## 2022-03-13 DIAGNOSIS — N6314 Unspecified lump in the right breast, lower inner quadrant: Secondary | ICD-10-CM | POA: Insufficient documentation

## 2022-03-13 LAB — CERVICOVAGINAL ANCILLARY ONLY
Chlamydia: NEGATIVE
Comment: NEGATIVE
Comment: NORMAL
Neisseria Gonorrhea: NEGATIVE

## 2022-03-13 LAB — RPR: RPR Ser Ql: NONREACTIVE

## 2022-03-13 LAB — HIV ANTIBODY (ROUTINE TESTING W REFLEX): HIV Screen 4th Generation wRfx: NONREACTIVE

## 2022-03-13 NOTE — Assessment & Plan Note (Signed)
Enlarging over few years. Physical exam suggestive of lipoma. Less likely fibrocystic changes. Very low suspicion for malignancy. Will obtain ultrasound for further characterization.

## 2022-04-11 ENCOUNTER — Ambulatory Visit
Admission: RE | Admit: 2022-04-11 | Discharge: 2022-04-11 | Disposition: A | Discharge status: Home / Self Care | Payer: Medicaid Other | Source: Ambulatory Visit | Attending: Family Medicine | Admitting: Family Medicine

## 2022-04-11 ENCOUNTER — Other Ambulatory Visit: Payer: Self-pay | Admitting: Family Medicine

## 2022-04-11 DIAGNOSIS — N6311 Unspecified lump in the right breast, upper outer quadrant: Secondary | ICD-10-CM | POA: Diagnosis not present

## 2022-04-11 DIAGNOSIS — N6314 Unspecified lump in the right breast, lower inner quadrant: Secondary | ICD-10-CM

## 2022-04-11 DIAGNOSIS — N6310 Unspecified lump in the right breast, unspecified quadrant: Secondary | ICD-10-CM

## 2022-04-23 ENCOUNTER — Ambulatory Visit
Admission: RE | Admit: 2022-04-23 | Discharge: 2022-04-23 | Disposition: A | Discharge status: Home / Self Care | Payer: Medicaid Other | Source: Ambulatory Visit | Attending: Family Medicine | Admitting: Family Medicine

## 2022-04-23 DIAGNOSIS — N6310 Unspecified lump in the right breast, unspecified quadrant: Secondary | ICD-10-CM

## 2022-04-23 DIAGNOSIS — N6314 Unspecified lump in the right breast, lower inner quadrant: Secondary | ICD-10-CM | POA: Diagnosis not present

## 2022-04-23 HISTORY — PX: BREAST BIOPSY: SHX20

## 2022-06-13 ENCOUNTER — Ambulatory Visit (INDEPENDENT_AMBULATORY_CARE_PROVIDER_SITE_OTHER): Payer: Medicaid Other | Admitting: Student

## 2022-06-13 VITALS — BP 101/70 | HR 96 | Temp 97.8°F | Ht 62.0 in | Wt 118.6 lb

## 2022-06-13 DIAGNOSIS — Z113 Encounter for screening for infections with a predominantly sexual mode of transmission: Secondary | ICD-10-CM | POA: Diagnosis not present

## 2022-06-13 NOTE — Progress Notes (Unsigned)
    SUBJECTIVE:   CHIEF COMPLAINT / HPI:   Started 12/6 and then stopped 12/30,  Not using any contraceptive  Took urine pregnancy  Never seen any bumps on partner, no diagnosis of herpes.  PERTINENT  PMH / PSH: ***  OBJECTIVE:   BP 101/70   Pulse 96   Temp 97.8 F (36.6 C)   Ht '5\' 2"'$  (1.575 m)   Wt 118 lb 9.6 oz (53.8 kg)   LMP 06/13/2022   SpO2 99%   BMI 21.69 kg/m  ***   ASSESSMENT/PLAN:   No problem-specific Assessment & Plan notes found for this encounter.     Orvis Brill, [place]    {    This will disappear when note is signed, click to select method of visit    :1}

## 2022-06-13 NOTE — Progress Notes (Unsigned)
    SUBJECTIVE:   CHIEF COMPLAINT / HPI:   Susan Hammond is a 22 year old female here with vulvar irritation.  She says that she noticed small bumps over her labia a few days ago.  She had her menstrual cycle and used an old pad that had been in her car for over a year which she says was exposed to perfumes and other products.  The next day, she noticed the bumps over her labia.  She wants to make sure that she does not have herpes, but she has not had any new sexual partners.  She denies any fever, vaginal odor, change in vaginal discharge.  She is not worried about sexually transmitted infections other than herpes.  PERTINENT  PMH / PSH: Reviewed  OBJECTIVE:   BP 101/70   Pulse 96   Temp 97.8 F (36.6 C)   Ht '5\' 2"'$  (1.575 m)   Wt 118 lb 9.6 oz (53.8 kg)   LMP 06/13/2022   SpO2 99%   BMI 21.69 kg/m   General: Well-appearing, no distress CV: Regular rate and rhythm Respiratory: Normal work of breathing on room air GU: Chaperone Desiree, CMA present for exam.  External vulva and vagina nonerythematous, without any obvious lesions or rash.  Currently menstruating.  ASSESSMENT/PLAN:   Routine screening for STI (sexually transmitted infection) Patient desired physical exam to ensure she did not have HSV infection.  I do not see any physical manifestations of HSV.  We discussed signs and symptoms of this including a usual prodrome followed by vesicular rash. Encouraged her to use safe sex behaviors with condoms. She declined any STI screening for chlamydia, gonorrhea, trichomonas, HIV, RPR which were all offered today. I provided reassurance to her today.  She is to follow-up if she has any further concerns. Discussed that given patient is not using any contraception and is engaging in sexual intercourse, she is at high risk for unplanned pregnancy.  I discussed various contraceptive methods today, which she declined.     Orvis Brill, [place]

## 2022-06-13 NOTE — Patient Instructions (Signed)
It was great seeing you today.  Your exam is normal today. I have no concerns. Like we discussed, if you develop concerns about bumps on your labia, please return so we can examine you again.    Have a wonderful day,  Dr. Orvis Brill [facility] [phone]

## 2022-06-14 NOTE — Assessment & Plan Note (Addendum)
Patient desired physical exam to ensure she did not have HSV infection.  I do not see any physical manifestations of HSV.  We discussed signs and symptoms of this including a usual prodrome followed by vesicular rash. Encouraged her to use safe sex behaviors with condoms. She declined any STI screening for chlamydia, gonorrhea, trichomonas, HIV, RPR which were all offered today. I provided reassurance to her today.  She is to follow-up if she has any further concerns. Discussed that given patient is not using any contraception and is engaging in sexual intercourse, she is at high risk for unplanned pregnancy.  I discussed various contraceptive methods today, which she declined.

## 2022-08-14 ENCOUNTER — Ambulatory Visit: Payer: Medicaid Other | Admitting: Family Medicine

## 2022-08-16 ENCOUNTER — Other Ambulatory Visit: Payer: Self-pay

## 2022-08-16 ENCOUNTER — Ambulatory Visit (INDEPENDENT_AMBULATORY_CARE_PROVIDER_SITE_OTHER): Payer: Medicaid Other | Admitting: Family Medicine

## 2022-08-16 ENCOUNTER — Encounter: Payer: Self-pay | Admitting: Family Medicine

## 2022-08-16 VITALS — BP 119/74 | HR 80 | Ht 62.0 in | Wt 119.2 lb

## 2022-08-16 DIAGNOSIS — M79605 Pain in left leg: Secondary | ICD-10-CM | POA: Insufficient documentation

## 2022-08-16 DIAGNOSIS — R109 Unspecified abdominal pain: Secondary | ICD-10-CM | POA: Insufficient documentation

## 2022-08-16 DIAGNOSIS — R350 Frequency of micturition: Secondary | ICD-10-CM | POA: Diagnosis not present

## 2022-08-16 HISTORY — DX: Pain in left leg: M79.605

## 2022-08-16 HISTORY — DX: Unspecified abdominal pain: R10.9

## 2022-08-16 LAB — POCT URINALYSIS DIP (MANUAL ENTRY)
Bilirubin, UA: NEGATIVE
Blood, UA: NEGATIVE
Glucose, UA: NEGATIVE mg/dL
Ketones, POC UA: NEGATIVE mg/dL
Leukocytes, UA: NEGATIVE
Nitrite, UA: NEGATIVE
Protein Ur, POC: NEGATIVE mg/dL
Spec Grav, UA: 1.025 (ref 1.010–1.025)
Urobilinogen, UA: 0.2 E.U./dL
pH, UA: 5.5 (ref 5.0–8.0)

## 2022-08-16 LAB — POCT URINE PREGNANCY: Preg Test, Ur: NEGATIVE

## 2022-08-16 NOTE — Patient Instructions (Signed)
It was great seeing you today!  For your leg tingling it will be important to stretch often during the day and incorporating exercise (recommend 150 min of exercise weekly)  For stomach spasms try increasing water intake. Stretches/ exercising may be helpful for this as well. Increasing water and fiber intake will be helpful for constipation. If needed can also use Miralax for your constipation.   We are checking your urine for infection. If anything is abnormal I will give you a call.   Return if symptoms persist beyond the next couple of weeks.   Feel free to call with any questions or concerns at any time, at [phone].   Take care,  Dr. Shary Key [facility]

## 2022-08-16 NOTE — Progress Notes (Signed)
    SUBJECTIVE:   CHIEF COMPLAINT / HPI:   Susan Hammond is a 22yo F who presents to the clinic with L leg pain and stomach discomfort. Both symptoms have been going on for about a month. The leg pain has come and gone and she is not experiencing any leg symptoms today. Describes it as a tingling sensation more so on her left side of hip and sometimes moves down her left leg and to her feet. Feels it improves when she is walking around. Does endorse sitting and laying down often throughout the day. The stomach pain is accompanied by constipation and bloating. Denies fever, cough, vaginal discharge. Pt also requests a UTI test. Endorses urinary frequency. Denies dysuria. Feels she is able to empty her bladder completely.   PERTINENT  PMH / PSH:  Reviewed   OBJECTIVE:   BP 119/74   Pulse 80   Ht 5\' 2"  (1.575 m)   Wt 119 lb 3.2 oz (54.1 kg)   LMP 07/16/2022   SpO2 100%   BMI 21.80 kg/m    General: alert, NAD CV: RRR, no MRG Pulm: normal breath sounds Abdomen: soft, non distended, non tender  MSK: normal ROM, strength, sensation, and proprioception in bilateral legs  ASSESSMENT/PLAN:   Leg pain, left Avoid prolonged sitting or immobilization. Stretch frequently and continue to exercise.  Stomach pain Pain is likely related to constipation. Take Miralax as needed to relieve symptoms.    Urinary frequency Without dysuria or urgency. Will check UA though low suspicion for infection. Also requests for pregnancy test as menstrual cycle was supposed to start today.  - UA and Upreg    Ozzie Hoyle, Medical Student [place]    I was personally present and performed or re-performed the history, physical exam and medical decision making activities of this service and have verified that the service and findings are accurately documented in the student's note. Suspect symptoms related to compressive neuropathy vs tight IT band. Recommended daily exercising and  stretching. Stomach cramping/twitching possibly MSK related as worsened by movement and not affected by diet. She does have constipation so could also be related to that. Recommended Miralax for constipation. Will follow up as needed if symptoms persist.  Shary Key, DO                  08/18/2022, 12:34 PM  [employer], PGY-3

## 2022-08-16 NOTE — Assessment & Plan Note (Signed)
Pain is likely related to constipation. Take Miralax as needed to relieve symptoms.

## 2022-08-16 NOTE — Assessment & Plan Note (Signed)
Avoid prolonged sitting or immobilization. Stretch frequently and continue to exercise.

## 2022-10-30 ENCOUNTER — Encounter: Payer: Self-pay | Admitting: Family Medicine

## 2022-10-30 ENCOUNTER — Ambulatory Visit (INDEPENDENT_AMBULATORY_CARE_PROVIDER_SITE_OTHER): Payer: Medicaid Other | Admitting: Family Medicine

## 2022-10-30 DIAGNOSIS — Z91199 Patient's noncompliance with other medical treatment and regimen due to unspecified reason: Secondary | ICD-10-CM

## 2022-11-01 NOTE — Progress Notes (Signed)
Patient canceled appointment 

## 2022-11-06 ENCOUNTER — Encounter: Payer: Self-pay | Admitting: Family Medicine

## 2022-11-06 ENCOUNTER — Ambulatory Visit (INDEPENDENT_AMBULATORY_CARE_PROVIDER_SITE_OTHER): Payer: Medicaid Other | Admitting: Family Medicine

## 2022-11-06 ENCOUNTER — Other Ambulatory Visit: Payer: Self-pay

## 2022-11-06 VITALS — BP 112/72 | HR 80 | Ht 62.0 in | Wt 134.4 lb

## 2022-11-06 DIAGNOSIS — K047 Periapical abscess without sinus: Secondary | ICD-10-CM | POA: Diagnosis not present

## 2022-11-06 DIAGNOSIS — Z111 Encounter for screening for respiratory tuberculosis: Secondary | ICD-10-CM | POA: Diagnosis present

## 2022-11-06 MED ORDER — AMOXICILLIN 500 MG PO TABS: 500.0000 mg | ORAL_TABLET | Freq: Two times a day (BID) | ORAL | 0 refills | Status: AC

## 2022-11-06 NOTE — Progress Notes (Signed)
    SUBJECTIVE:   CHIEF COMPLAINT / HPI:   Requests TB test  Wants abx for dental abscess  Feb went to dentist who to  For past 2 weeks has dental pain that comes and goes. Saw yesterday that she has some swelling around her tooth and also had lymph node swelling. Last time saw dentist was in [place]. Dentist doesn't have availability until August. Trying to find a new dentist   Feels she gets bloated  Has not been eating    PERTINENT  PMH / PSH: ***  OBJECTIVE:   BP 112/72   Pulse 80   Ht 5\' 2"  (1.575 m)   Wt 134 lb 6.4 oz (61 kg)   SpO2 100%   BMI 24.58 kg/m   ***  ASSESSMENT/PLAN:   No problem-specific Assessment & Plan notes found for this encounter.     Cora Collum, DO [facility]

## 2022-11-06 NOTE — Patient Instructions (Addendum)
It was great seeing you today!  For your tooth I have prescribed amoxicillin 500mg  twice a day for 7 days. It will also important to contact a dentist as soon as possible to get your teeth evaluated. I have provided a list of dentists that take medicaid.   Please make sure to return at the time discussed to have your tb results   Visit Reminders: - Stop by the pharmacy to pick up your prescriptions  - Continue to work on your healthy eating habits and incorporating exercise into your daily life.   Feel free to call with any questions or concerns at any time, at [phone].   Take care,  Dr. Cora Collum [facility]

## 2022-11-08 ENCOUNTER — Ambulatory Visit (INDEPENDENT_AMBULATORY_CARE_PROVIDER_SITE_OTHER): Payer: Medicaid Other

## 2022-11-08 DIAGNOSIS — K047 Periapical abscess without sinus: Secondary | ICD-10-CM | POA: Insufficient documentation

## 2022-11-08 DIAGNOSIS — Z111 Encounter for screening for respiratory tuberculosis: Secondary | ICD-10-CM

## 2022-11-08 DIAGNOSIS — Z0184 Encounter for antibody response examination: Secondary | ICD-10-CM

## 2022-11-08 NOTE — Progress Notes (Signed)
Patient presents to nurse clinic for PPD read.   PPD read 6/20 at 4:45 pm.  Results: negative Induration: 0  Patient dropped off [employer] physical exam form on Tuesday. Per form, patient needs two step PPD. Scheduled patient for 2nd PPD placement on Tuesday, 11/13/22. She also needs lab work for immunity testing for MMR, varicella, and Hep B. Received verbal order from Dr. Idalia Needle. Lab currently closed, patient will get labs at visit on 11/13/22.  Future orders placed.   Patient will attempt to obtain records for Tdap vaccination. If unable to obtain, she will get Tdap vaccine at nurse visit on 11/13/22.  Physical form placed back in Dr. Reynolds Bowl box for completion.   Susan Prude, RN

## 2022-11-08 NOTE — Assessment & Plan Note (Signed)
Patient presents with 2 weeks of intermittent tooth pain. Exam significant for swelling around bottom right molar, no abscess appreciated. Will treat with Amoxicillin 500mg  BID x 7 days.  Recommended calling to get into see a dentist as soon as possible.  Can treat pain with over-the-counter pain medication like Tylenol or ibuprofen.  Return precautions discussed

## 2022-11-09 LAB — TB SKIN TEST
Induration: 0 mm
TB Skin Test: NEGATIVE

## 2022-11-09 NOTE — Addendum Note (Signed)
Addended by: Veronda Prude on: 11/09/2022 02:17 PM   Modules accepted: Orders

## 2022-11-13 ENCOUNTER — Other Ambulatory Visit: Payer: Medicaid Other

## 2022-11-13 ENCOUNTER — Ambulatory Visit (INDEPENDENT_AMBULATORY_CARE_PROVIDER_SITE_OTHER): Payer: Medicaid Other

## 2022-11-13 DIAGNOSIS — Z111 Encounter for screening for respiratory tuberculosis: Secondary | ICD-10-CM

## 2022-11-13 DIAGNOSIS — Z0184 Encounter for antibody response examination: Secondary | ICD-10-CM | POA: Diagnosis not present

## 2022-11-13 NOTE — Progress Notes (Signed)
Patient is here for a PPD placement.  PPD placed in right forearm @ 2:09 pm.  Patient will return 11/15/2022 to have PPD read. Veronda Prude, RN

## 2022-11-14 LAB — MEASLES/MUMPS/RUBELLA IMMUNITY
MUMPS ABS, IGG: 197 AU/mL (ref >10.9)
RUBEOLA AB, IGG: 183 AU/mL (ref >16.4)
Rubella Antibodies, IGG: 1.94 index (ref >0.99)

## 2022-11-14 LAB — HEPATITIS B SURFACE ANTIBODY, QUANTITATIVE: Hepatitis B Surf Ab Quant: <3.5 m[IU]/mL — ABNORMAL LOW (ref >9.9)

## 2022-11-14 LAB — VARICELLA ZOSTER ANTIBODY, IGG: Varicella zoster IgG: 228 index (ref >165)

## 2022-11-15 ENCOUNTER — Telehealth: Payer: Self-pay

## 2022-11-15 ENCOUNTER — Ambulatory Visit (INDEPENDENT_AMBULATORY_CARE_PROVIDER_SITE_OTHER): Payer: Medicaid Other

## 2022-11-15 ENCOUNTER — Other Ambulatory Visit: Payer: Self-pay | Admitting: Family Medicine

## 2022-11-15 ENCOUNTER — Other Ambulatory Visit (INDEPENDENT_AMBULATORY_CARE_PROVIDER_SITE_OTHER): Payer: Medicaid Other | Admitting: Family Medicine

## 2022-11-15 DIAGNOSIS — Z23 Encounter for immunization: Secondary | ICD-10-CM

## 2022-11-15 DIAGNOSIS — R300 Dysuria: Secondary | ICD-10-CM

## 2022-11-15 DIAGNOSIS — N3000 Acute cystitis without hematuria: Secondary | ICD-10-CM

## 2022-11-15 LAB — POCT URINALYSIS DIP (MANUAL ENTRY)
Bilirubin, UA: NEGATIVE
Glucose, UA: NEGATIVE mg/dL — AB
Ketones, POC UA: NEGATIVE mg/dL
Nitrite, UA: NEGATIVE
Spec Grav, UA: >=1.03 — AB (ref 1.010–1.025)
Urobilinogen, UA: 0.2 E.U./dL
pH, UA: 5.5 (ref 5.0–8.0)

## 2022-11-15 LAB — TB SKIN TEST
Induration: 0 mm
TB Skin Test: NEGATIVE

## 2022-11-15 LAB — POCT UA - MICROSCOPIC ONLY: WBC, Ur, HPF, POC: >20 (ref 0–5)

## 2022-11-15 MED ORDER — CEPHALEXIN 500 MG PO CAPS
500.0000 mg | ORAL_CAPSULE | Freq: Two times a day (BID) | ORAL | 0 refills | Status: AC
Start: 2022-11-15 — End: 2022-11-20

## 2022-11-15 NOTE — Telephone Encounter (Signed)
Patient calls nurse line reporting UTI symptoms.   She reports dysuria and urinary pressure. She denies any fevers, chills, abdominal pain, abnormal smell or odor.   She reports she recently finished an antibiotic for a dental infection. However, she reports her symptoms do not feel like a yeast infection. She denies any discharge.   Unfortunately, we have limited availability the rest of this week.   Patient does have a nurse visit this afternoon for a PPD.   Will forward to PCP to see if a urine sample can be obtained at nurse visit.

## 2022-11-15 NOTE — Progress Notes (Signed)
Patient is here for a PPD read.  It was placed on 11/13/2022 in the right forearm @ 1409.    PPD RESULTS:  Result: negative Induration: 0 mm  Letter created and given to patient for documentation purposes.  Patient also needs Tdap vaccination. Temperature 98.4, denies recent fever or chills.  Administered in RD, site unremarkable, tolerated injection well. Patient declines Hep B vaccination at this time.   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 Copy made and placed in batch scanning.   Advised patient that provider would be reaching out regarding results of UA and culture.   Patient verbalizes understanding.     Veronda Prude, RN

## 2022-11-17 LAB — URINE CULTURE

## 2023-01-08 ENCOUNTER — Ambulatory Visit (INDEPENDENT_AMBULATORY_CARE_PROVIDER_SITE_OTHER): Payer: Medicaid Other

## 2023-01-08 DIAGNOSIS — Z23 Encounter for immunization: Secondary | ICD-10-CM

## 2023-01-11 ENCOUNTER — Other Ambulatory Visit: Payer: Self-pay

## 2023-01-11 ENCOUNTER — Inpatient Hospital Stay (HOSPITAL_COMMUNITY): Payer: Medicaid Other

## 2023-01-11 ENCOUNTER — Encounter (HOSPITAL_COMMUNITY): Payer: Self-pay | Admitting: Obstetrics & Gynecology

## 2023-01-11 ENCOUNTER — Encounter: Payer: Self-pay | Admitting: Family Medicine

## 2023-01-11 ENCOUNTER — Ambulatory Visit (INDEPENDENT_AMBULATORY_CARE_PROVIDER_SITE_OTHER): Payer: Medicaid Other | Admitting: Family Medicine

## 2023-01-11 ENCOUNTER — Inpatient Hospital Stay (HOSPITAL_COMMUNITY)
Admission: AD | Admit: 2023-01-11 | Discharge: 2023-01-11 | Disposition: A | Discharge status: Home / Self Care | Payer: Medicaid Other | Attending: Obstetrics & Gynecology | Admitting: Obstetrics & Gynecology

## 2023-01-11 VITALS — BP 119/73 | HR 87 | Ht 62.0 in | Wt 137.4 lb

## 2023-01-11 DIAGNOSIS — Z3200 Encounter for pregnancy test, result unknown: Secondary | ICD-10-CM | POA: Insufficient documentation

## 2023-01-11 DIAGNOSIS — O26891 Other specified pregnancy related conditions, first trimester: Secondary | ICD-10-CM | POA: Diagnosis present

## 2023-01-11 DIAGNOSIS — B3731 Acute candidiasis of vulva and vagina: Secondary | ICD-10-CM

## 2023-01-11 DIAGNOSIS — O26851 Spotting complicating pregnancy, first trimester: Secondary | ICD-10-CM | POA: Diagnosis not present

## 2023-01-11 DIAGNOSIS — Z3A01 Less than 8 weeks gestation of pregnancy: Secondary | ICD-10-CM | POA: Diagnosis not present

## 2023-01-11 DIAGNOSIS — R109 Unspecified abdominal pain: Secondary | ICD-10-CM | POA: Insufficient documentation

## 2023-01-11 DIAGNOSIS — O209 Hemorrhage in early pregnancy, unspecified: Secondary | ICD-10-CM | POA: Diagnosis not present

## 2023-01-11 LAB — URINALYSIS, ROUTINE W REFLEX MICROSCOPIC
Bilirubin Urine: NEGATIVE
Glucose, UA: NEGATIVE mg/dL
Ketones, ur: NEGATIVE mg/dL
Nitrite: NEGATIVE
Protein, ur: NEGATIVE mg/dL
Specific Gravity, Urine: 1.018 (ref 1.005–1.030)
pH: 5 (ref 5.0–8.0)

## 2023-01-11 LAB — WET PREP, GENITAL
Clue Cells Wet Prep HPF POC: NONE SEEN
Sperm: NONE SEEN
Trich, Wet Prep: NONE SEEN
WBC, Wet Prep HPF POC: >=10 — AB (ref <10)

## 2023-01-11 LAB — CBC
HCT: 34.6 % — ABNORMAL LOW (ref 36.0–46.0)
Hemoglobin: 10.3 g/dL — ABNORMAL LOW (ref 12.0–15.0)
MCH: 20.8 pg — ABNORMAL LOW (ref 26.0–34.0)
MCHC: 29.8 g/dL — ABNORMAL LOW (ref 30.0–36.0)
MCV: 69.9 fL — ABNORMAL LOW (ref 80.0–100.0)
Platelets: 273 10*3/uL (ref 150–400)
RBC: 4.95 MIL/uL (ref 3.87–5.11)
RDW: 17.5 % — ABNORMAL HIGH (ref 11.5–15.5)
WBC: 10.6 10*3/uL — ABNORMAL HIGH (ref 4.0–10.5)
nRBC: 0 % (ref 0.0–0.2)

## 2023-01-11 LAB — POCT URINE PREGNANCY: Preg Test, Ur: POSITIVE — AB

## 2023-01-11 LAB — HCG, QUANTITATIVE, PREGNANCY: hCG, Beta Chain, Quant, S: 159042 m[IU]/mL — ABNORMAL HIGH (ref <5)

## 2023-01-11 MED ORDER — TERCONAZOLE 0.4 % VA CREA: 1.0000 | TOPICAL_CREAM | Freq: Every day | VAGINAL | 0 refills | Status: DC

## 2023-01-11 MED ORDER — PRENATAL VITAMINS 28-0.8 MG PO TABS: 1.0000 | ORAL_TABLET | Freq: Every day | ORAL | 5 refills | Status: DC

## 2023-01-11 NOTE — MAU Note (Signed)
Susan Hammond is a 22 y.o. at [place] here in MAU reporting: she had +HPT yesterday and went to PCP for confirmation today.  Pt advised to be seen in MAU for dark brown spotting with wiping, states "not much" noted.  Denies pain, reports some intermittent  discomfort in lower abdomen.  Reports has been taking Miralax for constipation and feels like needs to take more. LMP: 11/22/2022 Onset of complaint: yesterday Pain score: 1 Vitals:   01/11/23 1714  BP: 108/67  Pulse: 74  Resp: 18  Temp: 98.3 F (36.8 C)  SpO2: 99%     FHT:NA Lab orders placed from triage:UA

## 2023-01-11 NOTE — Discharge Instructions (Signed)
[place] for [employer] at [employer] [facility]             [address] [place], [place] [address] [place] for [employer] at [place], [address], [place], [place], [address] [phone]  Center for [facility] at [place] [address], [place], [place], [place], [address] [phone]  Center for [facility] at [facility] [address] [place], [place], [place], [address] [phone]  Center for [place] at [facility]                                 [place], [place], [place], [address] [phone]  Center for [place] at [facility]                                    [address] [place], [place], [address] [place] for [place] at [facility] [address] [place], [place], [place], [address]                              [facility] [place], [place], [place], [place], [address] [phone]  [facility]         Phone: [phone]  [facility]      Phone: [facility]      Phone: [place] Department-Family Planning         Phone: [phone]   [employer] Department-Maternity    Phone: [place]      Phone: [phone]  [facility] [place]     Phone: [phone]  [facility]        Phone: [phone]  [facility] ([place]) [phone]  [facility]      Phone: [phone]                        Safe Medications in Pregnancy    Acne: Benzoyl Peroxide Salicylic Acid  Backache/Headache: Tylenol: 2 regular strength every 4 hours OR               2 Extra strength every 6 hours  Colds/Coughs/Allergies: Benadryl (alcohol free) 25 mg every 6 hours as needed Breath right strips Claritin Cepacol throat lozenges Chloraseptic throat spray Cold-Eeze- up to three times per day Cough drops, alcohol free Flonase (by prescription only) Guaifenesin Mucinex Robitussin DM (plain only, alcohol free) Saline nasal spray/drops Sudafed (pseudoephedrine) & Actifed ** use only after [redacted] weeks gestation and if you do not have high blood pressure Tylenol Vicks Vaporub  Zinc lozenges Zyrtec   Constipation: Colace Ducolax suppositories Fleet enema Glycerin suppositories Metamucil Milk of magnesia Miralax Senokot Smooth move tea  Diarrhea: Kaopectate Imodium A-D  *NO pepto Bismol  Hemorrhoids: Anusol Anusol HC Preparation H Tucks  Indigestion: Tums Maalox Mylanta Zantac  Pepcid  Insomnia: Benadryl (alcohol free) '25mg'$  every 6 hours as needed Tylenol PM Unisom, no Gelcaps  Leg Cramps: Tums MagGel  Nausea/Vomiting:  Bonine Dramamine Emetrol Ginger extract Sea bands Meclizine  Nausea medication to take during pregnancy:  Unisom (doxylamine succinate 25 mg tablets) Take one tablet daily at bedtime. If symptoms are not adequately controlled, the dose can be increased to a maximum recommended dose of two tablets daily (1/2 tablet in the morning, 1/2 tablet mid-afternoon and one at bedtime). Vitamin B6 '100mg'$  tablets. Take one tablet twice a day (up to 200 mg per day).  Skin Rashes: Aveeno products Benadryl cream or '25mg'$  every 6 hours as needed Calamine Lotion 1% cortisone cream  Yeast infection: Gyne-lotrimin 7 Monistat 7   **If taking multiple medications, please check labels to avoid duplicating the same active ingredients **take medication as directed on the label ** Do not exceed 4000 mg of tylenol in 24 hours **Do not take medications that contain aspirin or ibuprofen

## 2023-01-11 NOTE — Assessment & Plan Note (Signed)
Pregnancy test in office today is positive.  Based on last menstrual period patient is 7w 1d pregnant. EDD: 08/29/23.  Given her current bleeding and the fact that it is Fridayat 4 30 we have sent the patient for routine MAU at [facility] for urgent ultrasound. Preliminary prenatal labs have been drawn, and the patient has been instructed to make her first prenatal appointment after being evaluated in the MAU. Instructions for correct prenatal vitamin selection were also given to the patient.

## 2023-01-11 NOTE — Progress Notes (Signed)
    SUBJECTIVE:   CHIEF COMPLAINT / HPI:   Susan Hammond is here for pregnancy confirmation. She took a home pregnancy test on 8/22 which was positive. Her last menstrual period was on 7/4-7/9, and her menses are regular. She has been having nausea, constipation, and breast tenderness. She has been spotting since this morning, not enough to wear a panty liner, just on the toilet paper.   She has had no other pregnancies.   PERTINENT  PMH / PSH: No pertinent medical history  OBJECTIVE:   BP 119/73   Pulse 87   Ht 5\' 2"  (1.575 m)   Wt 137 lb 6 oz (62.3 kg)   LMP 11/22/2022   SpO2 100%   BMI 25.13 kg/m   General: Alert, oriented. No distress CV: RRR, no M/R/G Respiratory: Bilaterally Extremities: Pulses 2+ x 4.  No peripheral edema noted. GU: Jone Baseman present as chaperone.  Normal female external genitalia, 1 small but not consistent with shaving irritation left lower labia.   ASSESSMENT/PLAN:   Encounter for confirmation of pregnancy test result with physical examination Pregnancy test in office today is positive.  Based on last menstrual period patient is 7w 1d pregnant. EDD: 08/29/23.  Given her current bleeding and the fact that it is Fridayat 4 30 we have sent the patient for routine MAU at [facility] for urgent ultrasound. Preliminary prenatal labs have been drawn, and the patient has been instructed to make her first prenatal appointment after being evaluated in the MAU. Instructions for correct prenatal vitamin selection were also given to the patient.    Gerrit Heck, DO [facility]

## 2023-01-11 NOTE — MAU Provider Note (Signed)
Chief Complaint: Spotting   None     SUBJECTIVE HPI: Susan Hammond is a 22 y.o. G1P0 at [redacted]w[redacted]d by LMP who presents to maternity admissions reporting spotting. [facility] [place] [facility].  She does not have a prenatal care provider yet.   She denies vaginal itching/burning, urinary symptoms, h/a, dizziness, n/v, or fever/chills.    HPI  No past medical history on file. Past Surgical History:  Procedure Laterality Date   BREAST BIOPSY Right 04/23/2022   [place] RT BREAST BX W LOC DEV 1ST LESION IMG BX SPEC US GUIDE 04/23/2022 [facility]   Social History   Socioeconomic History   Marital status: Single    Spouse name: Not on file   Number of children: Not on file   Years of education: Not on file   Highest education level: Not on file  Occupational History   Not on file  Tobacco Use   Smoking status: Never    Passive exposure: Never   Smokeless tobacco: Never  Substance and Sexual Activity   Alcohol use: No   Drug use: No   Sexual activity: Yes    Partners: Male    Comment: unpotected   Other Topics Concern   Not on file  Social History Narrative   Not on file   Social Determinants of Health   Financial Resource Strain: Not on file  Food Insecurity: Not on file  Transportation Needs: Not on file  Physical Activity: Not on file  Stress: Not on file  Social Connections: Not on file  Intimate Partner Violence: Not on file   No current facility-administered medications on file prior to encounter.   Current Outpatient Medications on File Prior to Encounter  Medication Sig Dispense Refill   acetaminophen (TYLENOL) 325 MG tablet Take 2 tablets (650 mg total) by mouth every 6 (six) hours as needed. (Patient not taking: Reported on 05/26/2018) 30 tablet 0   ibuprofen (ADVIL,MOTRIN) 400 MG tablet Take 1 tablet (400 mg total) by mouth every 6 (six) hours as needed. (Patient not taking: Reported on 05/26/2018) 30 tablet 0   No Known  Allergies  ROS:  Review of Systems  Constitutional:  Negative for chills, fatigue and fever.  Respiratory:  Negative for shortness of breath.   Cardiovascular:  Negative for chest pain.  Gastrointestinal:  Negative for nausea and vomiting.  Genitourinary:  Positive for vaginal bleeding. Negative for difficulty urinating, dysuria, flank pain, pelvic pain, vaginal discharge and vaginal pain.  Neurological:  Negative for dizziness and headaches.  Psychiatric/Behavioral: Negative.       I have reviewed patient's Past Medical Hx, Surgical Hx, Family Hx, Social Hx, medications and allergies.   Physical Exam  Patient Vitals for the past 24 hrs:  BP Temp Temp src Pulse Resp SpO2 Height Weight  01/11/23 1714 108/67 98.3 F (36.8 C) Oral 74 18 99 % -- --  01/11/23 1708 -- -- -- -- -- -- 5\' 2"  (1.575 m) 63 kg   Constitutional: Well-developed, well-nourished female in no acute distress.  Cardiovascular: normal rate Respiratory: normal effort GI: Abd soft, non-tender. Pos BS x 4 MS: Extremities nontender, no edema, normal ROM Neurologic: Alert and oriented x 4.  GU: Neg CVAT.  PELVIC EXAM: self swabs collected   LAB RESULTS Results for orders placed or performed during the hospital encounter of 01/11/23 (from the past 24 hour(s))  CBC     Status: Abnormal   Collection Time: 01/11/23  5:38 PM  Result Value Ref Range   WBC 10.6 (H) 4.0 - 10.5 K/uL   RBC 4.95 3.87 - 5.11 MIL/uL   Hemoglobin 10.3 (L) 12.0 - 15.0 g/dL   HCT 95.6 (L) 21.3 - 08.6 %   MCV 69.9 (L) 80.0 - 100.0 fL   MCH 20.8 (L) 26.0 - 34.0 pg   MCHC 29.8 (L) 30.0 - 36.0 g/dL   RDW 57.8 (H) 46.9 - 62.9 %   Platelets 273 150 - 400 K/uL   nRBC 0.0 0.0 - 0.2 %  hCG, quantitative, pregnancy     Status: Abnormal   Collection Time: 01/11/23  5:38 PM  Result Value Ref Range   hCG, Beta Chain, Quant, S 159,042 (H) <5 mIU/mL  Wet prep, genital     Status: Abnormal   Collection Time: 01/11/23  5:39 PM   Specimen: PATH Cytology  Cervicovaginal Ancillary Only  Result Value Ref Range   Yeast Wet Prep HPF POC PRESENT (A) NONE SEEN   Trich, Wet Prep NONE SEEN NONE SEEN   Clue Cells Wet Prep HPF POC NONE SEEN NONE SEEN   WBC, Wet Prep HPF POC >=10 (A) <10   Sperm NONE SEEN   Urinalysis, Routine w reflex microscopic -Urine, Clean Catch     Status: Abnormal   Collection Time: 01/11/23  5:44 PM  Result Value Ref Range   Color, Urine YELLOW YELLOW   APPearance CLEAR CLEAR   Specific Gravity, Urine 1.018 1.005 - 1.030   pH 5.0 5.0 - 8.0   Glucose, UA NEGATIVE NEGATIVE mg/dL   Hgb urine dipstick MODERATE (A) NEGATIVE   Bilirubin Urine NEGATIVE NEGATIVE   Ketones, ur NEGATIVE NEGATIVE mg/dL   Protein, ur NEGATIVE NEGATIVE mg/dL   Nitrite NEGATIVE NEGATIVE   Leukocytes,Ua TRACE (A) NEGATIVE   RBC / HPF 0-5 0 - 5 RBC/hpf   WBC, UA 0-5 0 - 5 WBC/hpf   Bacteria, UA RARE (A) NONE SEEN   Squamous Epithelial / HPF 0-5 0 - 5 /HPF   Mucus PRESENT        IMAGING US OB LESS THAN 14 WEEKS WITH OB TRANSVAGINAL  Result Date: 01/11/2023 CLINICAL DATA:  Spotting EXAM: OBSTETRIC <14 WK ULTRASOUND TECHNIQUE: Transabdominal ultrasound was performed for evaluation of the gestation as well as the maternal uterus and adnexal regions. COMPARISON:  None Available. FINDINGS: Intrauterine gestational sac: Single Yolk sac:  Visualized. Embryo:  Visualized. Cardiac Activity: Visualized. Heart Rate: 153 bpm CRL:   8.8 mm   6 w 6 d                  [place] EDC: 08/31/2023 Subchorionic hemorrhage:  None visualized. Maternal uterus/adnexae: Unremarkable IMPRESSION: Single intrauterine gestation at sonographic gestational age of [redacted] weeks, 6 days. Fetal heart rate 153 beats per minute. EDD 08/31/2023. Electronically Signed   By: Jearld Lesch M.D.   On: 01/11/2023 19:05    MAU Management/MDM: Orders Placed This Encounter  Procedures   Wet prep, genital   US OB LESS THAN 14 WEEKS WITH OB TRANSVAGINAL   Urinalysis, Routine w reflex microscopic -Urine,  Clean Catch   CBC   hCG, quantitative, pregnancy   Discharge patient    Meds ordered this encounter  Medications   terconazole (TERAZOL 7) 0.4 % vaginal cream    Sig: Place 1 applicator vaginally at bedtime.    Dispense:  45 g    Refill:  0    Order Specific Question:   Supervising Provider  Answer:   Despina Hidden, LUTHER H [[id number]]   Prenatal Vit-Fe Fumarate-FA (PRENATAL VITAMINS) 28-0.8 MG TABS    Sig: Take 1 tablet by mouth daily.    Dispense:  30 tablet    Refill:  5    Order Specific Question:   Supervising Provider    Answer:   Despina Hidden, LUTHER H [[id number]]    Viable IUP on today's Korea. Yeast noted on wet prep, so will treat with Rx for Terazol 7.  Pt to begin prenatal care, list of providers given.  Return to MAU as needed for emergencies.   ASSESSMENT 1. Vaginal candidiasis   2. Vaginal bleeding in pregnancy, first trimester   3. [redacted] weeks gestation of pregnancy     PLAN Discharge home Allergies as of 01/11/2023   No Known Allergies      Medication List     STOP taking these medications    ibuprofen 400 MG tablet Commonly known as: ADVIL       TAKE these medications    acetaminophen 325 MG tablet Commonly known as: Tylenol Take 2 tablets (650 mg total) by mouth every 6 (six) hours as needed.   Prenatal Vitamins 28-0.8 MG Tabs Take 1 tablet by mouth daily.   terconazole 0.4 % vaginal cream Commonly known as: TERAZOL 7 Place 1 applicator vaginally at bedtime.        Follow-up Information     Prenatal provider of your choice Follow up.   Why: See list provided        [facility] Follow up.   Specialty: Obstetrics and Gynecology Why: As needed for emergencies Contact information: [address] [place] [address] [phone]                Sharen Counter Certified Nurse-Midwife 01/11/2023  7:27 PM

## 2023-01-11 NOTE — Patient Instructions (Signed)
It was wonderful to see you today!  Today we confirmed that you are in fact pregnant.  Because you are having some bleeding and our office will be closed for the weekend it will be best for you to go directly to the maternal emergency department or MAU at [facility] for an ultrasound to confirm that your pregnancy it is in your uterus.  They will also do some blood tests to confirm your blood type as this can also cause issues during pregnancy.  Please show them discharge summary when you arrive so they know why we are sending you to them.   Please call the office at the number listed below to schedule your first prenatal appointment with Dr. Marsh Dolly.  As discussed you have an over-the-counter monitor should start taking it every day.  Please make sure that it has at least 400 mcg of folic acid.  Please call [phone] with any questions about today's appointment.   If you need any additional refills, please call your pharmacy before calling the office.  Gerrit Heck, DO Family Medicine

## 2023-01-13 LAB — CBC/D/PLT+RPR+RH+ABO+RUBIGG...
Antibody Screen: NEGATIVE
Basophils Absolute: 0 10*3/uL (ref 0.0–0.2)
Basos: 0 %
Bilirubin, UA: NEGATIVE
EOS (ABSOLUTE): 0.2 10*3/uL (ref 0.0–0.4)
Eos: 2 %
Glucose, UA: NEGATIVE
HCV Ab: NONREACTIVE
HIV Screen 4th Generation wRfx: NONREACTIVE
Hematocrit: 34.1 % (ref 34.0–46.6)
Hemoglobin: 10.3 g/dL — ABNORMAL LOW (ref 11.1–15.9)
Immature Grans (Abs): 0.1 10*3/uL (ref 0.0–0.1)
Immature Granulocytes: 1 %
Lymphocytes Absolute: 2.4 10*3/uL (ref 0.7–3.1)
Lymphs: 25 %
MCH: 20.5 pg — ABNORMAL LOW (ref 26.6–33.0)
MCHC: 30.2 g/dL — ABNORMAL LOW (ref 31.5–35.7)
MCV: 68 fL — ABNORMAL LOW (ref 79–97)
Monocytes Absolute: 1.2 10*3/uL — ABNORMAL HIGH (ref 0.1–0.9)
Monocytes: 13 %
Neutrophils Absolute: 5.7 10*3/uL (ref 1.4–7.0)
Neutrophils: 59 %
Nitrite, UA: NEGATIVE
Platelets: 313 10*3/uL (ref 150–450)
RBC: 5.03 x10E6/uL (ref 3.77–5.28)
RDW: 17 % — ABNORMAL HIGH (ref 11.7–15.4)
RPR Ser Ql: NONREACTIVE
Rh Factor: POSITIVE
Rubella Antibodies, IGG: 1.84 {index} (ref >0.99)
Specific Gravity, UA: 1.025 (ref 1.005–1.030)
Urobilinogen, Ur: 1 mg/dL (ref 0.2–1.0)
WBC: 9.5 10*3/uL (ref 3.4–10.8)
pH, UA: 5.5 (ref 5.0–7.5)

## 2023-01-13 LAB — URINE CULTURE, OB REFLEX

## 2023-01-13 LAB — MICROSCOPIC EXAMINATION
Casts: NONE SEEN /LPF
Epithelial Cells (non renal): >10 /HPF — AB (ref 0–10)

## 2023-01-13 LAB — HEPATITIS B SURFACE AG, CONFIRM: HBsAG Confirmation: POSITIVE — AB

## 2023-01-13 LAB — HCV INTERPRETATION

## 2023-01-14 LAB — GC/CHLAMYDIA PROBE AMP (~~LOC~~) NOT AT ARMC
Chlamydia: NEGATIVE
Comment: NEGATIVE
Comment: NORMAL
Neisseria Gonorrhea: NEGATIVE

## 2023-01-31 ENCOUNTER — Ambulatory Visit (INDEPENDENT_AMBULATORY_CARE_PROVIDER_SITE_OTHER): Payer: Medicaid Other | Admitting: Student

## 2023-01-31 ENCOUNTER — Other Ambulatory Visit (HOSPITAL_COMMUNITY)
Admission: RE | Admit: 2023-01-31 | Discharge: 2023-01-31 | Disposition: A | Discharge status: Home / Self Care | Payer: Medicaid Other | Source: Ambulatory Visit | Attending: Family Medicine | Admitting: Family Medicine

## 2023-01-31 VITALS — BP 115/62 | HR 99 | Wt 137.6 lb

## 2023-01-31 DIAGNOSIS — N949 Unspecified condition associated with female genital organs and menstrual cycle: Secondary | ICD-10-CM

## 2023-01-31 MED ORDER — VALACYCLOVIR HCL 1 G PO TABS
1000.0000 mg | ORAL_TABLET | Freq: Two times a day (BID) | ORAL | 0 refills | Status: AC
Start: 2023-01-31 — End: 2023-02-05

## 2023-01-31 NOTE — Patient Instructions (Addendum)
Ms. Castleman,  Based on how that lesion looks, I have a very high suspicion for herpes. I know this is disappointing news, but thankfully it is something we can treat. I'm sending pills to your pharmacy. You should take one tablet twice daily for five days. I'm sending some extra pills to have on hand for future flares as well.  I'm doing some confirmatory testing and will let you know by phone if these show anything different.  I reset your MyChart Password to [email or web address] please log in and change this to your preferred code.    Eliezer Mccoy, MD

## 2023-02-01 DIAGNOSIS — A6000 Herpesviral infection of urogenital system, unspecified: Secondary | ICD-10-CM | POA: Insufficient documentation

## 2023-02-01 DIAGNOSIS — N949 Unspecified condition associated with female genital organs and menstrual cycle: Secondary | ICD-10-CM | POA: Insufficient documentation

## 2023-02-01 LAB — HCV INTERPRETATION

## 2023-02-01 LAB — HIV ANTIBODY (ROUTINE TESTING W REFLEX): HIV Screen 4th Generation wRfx: NONREACTIVE

## 2023-02-01 LAB — HCV AB W REFLEX TO QUANT PCR: HCV Ab: NONREACTIVE

## 2023-02-01 LAB — RPR: RPR Ser Ql: NONREACTIVE

## 2023-02-01 NOTE — Progress Notes (Signed)
    SUBJECTIVE:   CHIEF COMPLAINT / HPI:   Vaginal Lesion Here for evaluation of vaginal lesion. Tells me this is a recurring issue and that she's been told it is from ingrown hairs. Wondering how to prevent in the future. Of note, she is [redacted] weeks pregnant. One female sexual partner recently.  She tells me that this present lesion has been present for a few days and that the natural history of these lesions is that they "blister up" and then ulcerate.   OBJECTIVE:   BP 115/62   Pulse 99   Wt 137 lb 9.6 oz (62.4 kg)   LMP 11/22/2022   BMI 25.17 kg/m   Chaperone Present throughout exam Gen: Very pleasant, in good spirits and NAD GU: There is a ~43mm light colored vesicle with an erythematous base to the left side of the clitoral hood with a very small (1-65mm) area of ulceration inferior to it. The remainder of the vulva appears normal. No internal exam performed at this time.   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 Will treat empirically at this time and have collected HSV culture. Will also swab for GC/Chlamydia and collect RPR, HIV for good measure. If HSV positive will need to ensure her OB is aware.  - Empiric treatment with Valtrex - HSV culture - Aptima swab collected for GC/Chlamydia/Trichomonas  - HIV, RPR, Hep C     J Dorothyann Gibbs, MD [facility]

## 2023-02-01 NOTE — Assessment & Plan Note (Signed)
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 Will treat empirically at this time and have collected HSV culture. Will also swab for GC/Chlamydia and collect RPR, HIV for good measure. If HSV positive will need to ensure her OB is aware.  - Empiric treatment with Valtrex - HSV culture - Aptima swab collected for GC/Chlamydia/Trichomonas  - HIV, RPR, Hep C

## 2023-02-03 LAB — HERPES SIMPLEX VIRUS CULTURE

## 2023-02-04 LAB — CERVICOVAGINAL ANCILLARY ONLY
Bacterial Vaginitis (gardnerella): NEGATIVE
Candida Glabrata: NEGATIVE
Candida Vaginitis: NEGATIVE
Chlamydia: NEGATIVE
Comment: NEGATIVE
Comment: NEGATIVE
Comment: NEGATIVE
Comment: NEGATIVE
Comment: NEGATIVE
Comment: NORMAL
Neisseria Gonorrhea: NEGATIVE
Trichomonas: NEGATIVE

## 2023-02-08 ENCOUNTER — Ambulatory Visit (INDEPENDENT_AMBULATORY_CARE_PROVIDER_SITE_OTHER): Payer: Medicaid Other | Admitting: *Deleted

## 2023-02-08 VITALS — BP 105/65 | HR 75 | Wt 133.1 lb

## 2023-02-08 DIAGNOSIS — Z3480 Encounter for supervision of other normal pregnancy, unspecified trimester: Secondary | ICD-10-CM | POA: Diagnosis not present

## 2023-02-08 DIAGNOSIS — Z3401 Encounter for supervision of normal first pregnancy, first trimester: Secondary | ICD-10-CM | POA: Diagnosis not present

## 2023-02-08 DIAGNOSIS — Z1339 Encounter for screening examination for other mental health and behavioral disorders: Secondary | ICD-10-CM | POA: Diagnosis not present

## 2023-02-08 DIAGNOSIS — Z3A11 11 weeks gestation of pregnancy: Secondary | ICD-10-CM | POA: Diagnosis not present

## 2023-02-08 MED ORDER — BLOOD PRESSURE KIT DEVI
1.0000 | 0 refills | Status: DC
Start: 2023-02-08 — End: 2023-07-09

## 2023-02-08 NOTE — Progress Notes (Signed)
New OB Intake  I connected with Susan Hammond  on 02/08/23 at 10:15 AM EDT by In Person Visit and verified that I am speaking with the correct person using two identifiers. Nurse is located at [facility] and pt is located at [place].  I discussed the limitations, risks, security and privacy concerns of performing an evaluation and management service by telephone and the availability of in person appointments. I also discussed with the patient that there may be a patient responsible charge related to this service. The patient expressed understanding and agreed to proceed.  I explained I am completing New OB Intake today. We discussed EDD of 08/29/2023, by Last Menstrual Period. Pt is G1P0000. I reviewed her allergies, medications and Medical/Surgical/OB history.    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 Discussed the nature of our practice with multiple providers including residents and students. Due to the size of the practice, the delivering provider may not be the same as those providing prenatal care.   Patient is interested in water birth. Offered upcoming OB visit with CNM to discuss further.  MyChart/Babyscripts MyChart access verified. I explained pt will have some visits in office and some virtually. Babyscripts  instructions given and order placed. Patient verifies receipt of registration text/e-mail. Account successfully created and app downloaded.  Blood Pressure Cuff/Weight Scale Blood pressure cuff ordered for patient to pick-up from [employer]. Explained after first prenatal appt pt will check weekly and document in Babyscripts. Patient does not have weight scale; patient may purchase if they desire to track weight weekly in Babyscripts.  Anatomy US Explained first scheduled Korea will be around 19 weeks. Anatomy US scheduled for TBD at TBD.  Interested in [place]? If yes, send referral and doula dot phrase.   Is patient a candidate for Babyscripts Optimization? Yes  First visit review I reviewed new OB appt with patient. Explained pt will be seen by TBD at first visit. Discussed [name] genetic screening with patient. Requests Panorama and Horizon.. Routine prenatal labs  collected 01/11/23 and 01/31/23. Natera collected today.    Last Pap Diagnosis  Date Value Ref Range Status  03/15/2020   Final   - Negative for intraepithelial lesion or malignancy (NILM)    Harrel Lemon, RN 02/08/2023  11:03 AM

## 2023-02-08 NOTE — Patient Instructions (Signed)
[facility] [employer] has a partnership with the [employer] to provide prenatal navigation for the most needed resources in our community. In order to see how we can help connect you to these resources we need consent to contact you. Please complete the very short consent using the link below:   English Link: https://[place].tfaforms.net/283?site=16  Spanish Link: https://[place].tfaforms.net/287?site=16  Options for Doula Care in the [place] Area  As you review your birthing options, consider having a birth doula. A doula is trained to provide support before, during and just after you give birth. There are also postpartum doulas that help you adjust to new parenthood.  While doulas do not provide medical care, they do provide emotional, physical and educational support. A few months before your baby arrives, doulas can help answer questions, ease concerns and help you create and support your birthing plan.    Doulas can help reduce your stress and comfort you and your partner. They can help you cope with labor by helping you use breathing techniques, massage, creative labor positioning, essential oils and affirmations.   Studies show that the benefits of having a doula include:   A more positive birth experience  Fewer requests for pain-relief medication  Less likelihood of cesarean section, commonly called a c-section   Doulas are typically hired via a [employer] account planner between you and the doula. We are happy to provide a list of the most active doulas in the area, all of whom are credentialed by [facility] and will not count as a visitor at your birth.  There are several options for no-cost doula care at our hospital, including:  [facility] Volunteer Doula Program Every [employer] Program A Cure 4 Moms Doula Study (available only at [employer] for Women, [place], [place] and [employer] [facility] offices)  For more information on these programs or to receive  a list of doulas active in our area, please email [email or web address]  Our practice his participating in a study that provides no-cost doula care. ACURE4Moms is a study looking at how doula care can reduce birthing disparities for Black and brown birthing people. We like to refer patients as soon as possible, but definitely before 28 weeks so patients can get to know their doula.    A doula is trained to provide support before, during and just after you give birth. While doulas do not provide medical care, they do provide emotional, physical and educational support. Doulas can help reduce your stress and comfort you and your partner. They can help you cope with labor by helping you use breathing techniques, massage, creative labor positioning, essential oils and affirmations.   ACURE4Moms is a research study trying to reduce:   low birthweight babies  emergency department visits & hospitalizations for birthing persons and their babies  depression among birthing people  discrimination in pregnancy-related care ACURE4Moms is trying out 2 programs designed by  people who have given birth. These programs include: 1. Sharing patient data and warning alerts with clinic staff to keep them accountable for their patients' outcomes and providing tools to help them  reduce bias in care. 2. Matching eligible patients with doulas from the  same community as the patients.  If you would like to participate in this study, please visit:   http://carroll-castaneda.info/

## 2023-02-08 NOTE — Progress Notes (Signed)
Pt had formal dating and viability Korea on 01/11/23 @ [redacted]w[redacted]d. Informal BS Korea today. Frequent fetal movement observed. Fetal heart motion observed. FHR 180 (after big movement). Fetal appearance/ growth consistent with expected GA.

## 2023-02-16 LAB — PANORAMA PRENATAL TEST FULL PANEL:PANORAMA TEST PLUS 5 ADDITIONAL MICRODELETIONS: FETAL FRACTION: 9.8

## 2023-02-20 ENCOUNTER — Encounter: Payer: Self-pay | Admitting: Obstetrics and Gynecology

## 2023-02-20 DIAGNOSIS — D563 Thalassemia minor: Secondary | ICD-10-CM | POA: Insufficient documentation

## 2023-02-20 LAB — HORIZON CUSTOM: REPORT SUMMARY: POSITIVE — AB

## 2023-02-27 ENCOUNTER — Other Ambulatory Visit: Payer: Self-pay

## 2023-02-27 DIAGNOSIS — D563 Thalassemia minor: Secondary | ICD-10-CM

## 2023-03-08 ENCOUNTER — Telehealth: Payer: Self-pay

## 2023-03-08 NOTE — Telephone Encounter (Signed)
Patient called and left message on vm stating that she has noticed some clear discharge from her breast.  Returned patient call. No answer. Unable to leave vm. Vm box is not set up yet.

## 2023-03-11 ENCOUNTER — Ambulatory Visit: Payer: Medicaid Other | Attending: Obstetrics and Gynecology | Admitting: Obstetrics and Gynecology

## 2023-03-11 ENCOUNTER — Ambulatory Visit: Payer: Self-pay | Admitting: Maternal & Fetal Medicine

## 2023-03-11 DIAGNOSIS — D563 Thalassemia minor: Secondary | ICD-10-CM | POA: Diagnosis not present

## 2023-03-12 ENCOUNTER — Telehealth: Payer: Self-pay

## 2023-03-12 NOTE — Telephone Encounter (Signed)
Patient calls nurse line requesting an apt.   She reports she has been suffering from constipation for the last 10 days.   She reports she is ~[redacted] weeks pregnant and reports she was instructed to take Miralax and drink prune juice. She reports she has been doing both for several days now with no relief. She reports she last took Miralax 2 days ago.   She reports she reached out to her OB, however has not heard back.   She denies any abdominal pain, nausea or vomiting. She denies any spotting or bleeding.   Patient scheduled for tomorrow for evaluation.   MAU/ED precautions discussed.

## 2023-03-13 ENCOUNTER — Ambulatory Visit (INDEPENDENT_AMBULATORY_CARE_PROVIDER_SITE_OTHER): Payer: Medicaid Other | Admitting: Student

## 2023-03-13 ENCOUNTER — Encounter: Payer: Self-pay | Admitting: Student

## 2023-03-13 VITALS — BP 112/68 | HR 85 | Ht 62.0 in | Wt 136.2 lb

## 2023-03-13 DIAGNOSIS — K5900 Constipation, unspecified: Secondary | ICD-10-CM

## 2023-03-13 DIAGNOSIS — O926 Galactorrhea: Secondary | ICD-10-CM | POA: Insufficient documentation

## 2023-03-13 NOTE — Assessment & Plan Note (Signed)
Shared decision making with patient to continue with MiraLAX at increased dose.  Instructed patient that she could continue taking capfuls until she has a bowel movement.  If she is tried a bowel cleanout and has not been successful, will send a prescription in for lactulose.

## 2023-03-13 NOTE — Assessment & Plan Note (Addendum)
With normal breast exam and unable to express milk on my exam, reassured patient that this is most likely normal with her pregnancy.  Gave instructions on how to prevent further leaking and strict return precautions.  Reassured by normal prior breast biopsy of mass showing lipoma, no new masses on today's exam, and normal-appearing breast discharge.   Patient is well appearing with no fever, chills, normal BP without signs for sepsis. Fetal doppler not preformed today. Patient has an appointment with OB/GYN next week to establish care and for ultrasound.

## 2023-03-13 NOTE — Progress Notes (Addendum)
    SUBJECTIVE:   CHIEF COMPLAINT / HPI:   Susan Hammond is a 22 y.o. female  presenting for breast discharge and constipation.  She is currently [redacted] weeks pregnant.  Breast discharge: Patient reports 1 week ago she started having intermittent leaking from both her breast that moved to just her left breast.  She reports the discharge is clear to white.  She has no history of malignancy however she has had a biopsy of her right breast for a mass that was fibrous breast tissue.  She reports in her first trimester she had tenderness in her breast however the pain is improved.  She denies new masses or lumps.  She denies black bloody or abnormally colored fluid leaking from the breast or skin dimpling.  Constipation: Her last bowel movement was 10/11.  She has tried MiraLAX and prune juice at home however she has not taken MiraLAX in 3 days.  She reports she is follow the instructions on the box and is only taken 1 capful and says that this is only been successful once earlier on in her pregnancy.  She has not tried any other over-the-counter medications.  PERTINENT  PMH / PSH: Reviewed and updated   OBJECTIVE:   BP 112/68   Pulse 85   Ht 5\' 2"  (1.575 m)   Wt 136 lb 3.2 oz (61.8 kg)   LMP 11/22/2022   SpO2 100%   BMI 24.91 kg/m   Well-appearing, no acute distress Cardio: Regular rate, regular rhythm, no murmurs on exam. Pulm: Clear, no wheezing, no crackles. No increased work of breathing Psych:  Cognition and judgment appear intact. Alert, communicative  and cooperative with normal attention span and concentration. No apparent delusions, illusions, hallucinations   Breast exam: MA chaperone present Left breast: No lumps, skin dimpling, not able to express discharge  Right breast: Chronic 2 x 3 cm mass appreciated at 5 o'clock position previously biopsied with scar present, no discharge able to be expressed, no skin dimpling Bilateral axillary exam negative for lymphadenopathy      03/13/2023    8:25 AM 02/08/2023   10:48 AM 01/31/2023    4:02 PM  PHQ9 SCORE ONLY  PHQ-9 Total Score 2 0 0      ASSESSMENT/PLAN:   Constipation Shared decision making with patient to continue with MiraLAX at increased dose.  Instructed patient that she could continue taking capfuls until she has a bowel movement.  If she is tried a bowel cleanout and has not been successful, will send a prescription in for lactulose.  Galactorrhea during pregnancy With normal breast exam and unable to express milk on my exam, reassured patient that this is most likely normal with her pregnancy.  Gave instructions on how to prevent further leaking and strict return precautions.  Reassured by normal prior breast biopsy of mass showing lipoma, no new masses on today's exam, and normal-appearing breast discharge.   Patient is well appearing with no fever, chills, normal BP without signs for sepsis. Fetal doppler not preformed today. Patient has an appointment with OB/GYN next week to establish care and for ultrasound.    Will forward chart to [facility] Admin team to offer patient to come in tomorrow to get fetal doppler tones since this was not completed at this visit.   Glendale Chard, DO [facility]

## 2023-03-13 NOTE — Progress Notes (Signed)
Virtual Visit via Video Note  I connected with Susan Hammond on 03/11/23 at 1:00 PM EDT by a video enabled telemedicine application and verified that I am speaking with the correct person using two identifiers. Susan Hammond was joined by her mother on the call. The patient was counseled by [name], genetic counseling intern, supervised by [name], MS, CGC.  Location: Patient: Home Provider: [facility] Referring provider: Dr. Rande Lawman, Center for [facility] of Consultation: 30 minutes  Prior Genetic Testing: Susan Hammond was referred for genetic counseling with [facility] due to a positive carrier screen for alpha thalassemia. Susan Hammond received Horizon Basic carrier screening for 4 of the most common recessive conditions in the general population: alpha thalassemia, beta hemoglobinopathies, spinal muscular atrophy, and cystic fibrosis. Susan Hammond tested negative for beta hemoglobinopathies, spinal muscular atrophy, and cystic fibrosis. These negative results greatly reduce, but cannot eliminate, the chance to have a child with these conditions.  See the [name] report for detection rates and residual risk calculations. 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  Alpha Thalassemia: Alpha thalassemia describes a range of inherited disorders that affect the formation of the alpha chain of hemoglobin. This range of conditions is caused by one or more alterations (most commonly deletions) in the HBA gene. Humans have a total of 4 copies of the HBA gene, 2 of which are inherited from each parent.  A deletion in one of the 4 HBA genes is associated with being a silent carrier for alpha thalassemia (consistent with Susan Hammond's result). Individuals who are silent carriers are typically asymptomatic but may be at risk of having a child with more  severe disease depending on their partner's carrier status.  Deletions or structural variants in 2 of the 4 HBA genes are associated with alpha thalassemia minor. Individuals with alpha thalassemia minor may have mild anemia or no symptoms. The 2 deletions may be on the same chromosome (in cis) or different chromosomes (in trans). The cis configuration is most common in individuals with Asian ancestry, and the trans configuration is most common in individuals with African American ancestry.  Deletions or structural variants in 3 of the 4 HBA genes are associated with hemoglobin H disease. Symptoms of this condition include moderate to severe anemia, jaundice, slow growth, and bone differences among other variable features. Individuals with hemoglobin H disease may need blood transfusions throughout their life. With proper care, individuals with this condition can have a typical life span.  Dysfunction of all 4 HBA genes is associated with hemoglobin Bart's disease (hemoglobin Bart's, or hydrops fetalis). This condition is associated with severe anemia and fetal hydrops. Fetuses affected with hemoglobin Bart's disease typically pass away during the prenatal or newborn period. We discussed that since Ms. Simison will pass down 1 or 2 copies of the HBA gene to each of her pregnancies (at least 1 copy), none of her pregnancies are at risk for hemoglobin Bart's disease regardless of her partner's status.  If Susan Hammond's partner has beta thalassemia minor (2 HBA variants) in the cis configuration, there is a 1 in 4 chance that the pregnancy will be affected with hemoglobin H disease. We discussed that because Susan Hammond's partner is of African American descent, his chance of being a carrier is 1 in 72. However, if Susan Hammond's partner has 2 HBA deletions, it  is most likely that the deletions are in the trans configuration due to his African American ethnicity. If the variants are in trans, the pregnancy is  not at risk for hemoglobin H disease. Without additional testing, we would estimate the chance that their current pregnancy may be affected with hemoglobin H disease could be as high as 1 in 120 (1/30 x ). We presented the option of carrier screening for alpha thalassemia for Susan Hammond's partner, Susan Hammond. Susan Hammond accepted this offer on Susan Hammond behalf.  We informed Ms. Booz that amniocentesis is the most definitive test for determining if the pregnancy is affected with hemoglobin H disease. This test can be performed between approximately 16 weeks into the pregnancy and term. The procedure comes with a small risk of complications that lead to miscarriage, which is approximated at 1 in 500 (0.2%). Susan Hammond declined amniocentesis at this time.  Family History A three-generation pedigree and detailed pregnancy history were obtained during the visit. Susan Hammond family history is significant for a maternal first cousin with trisomy 64 and a maternal first cousin with autism. We discussed that the majority of Down syndrome happens by chance during formation of egg or sperm cells. Most of the time (~96%), this condition is not inherited. Translocation Down syndrome, which makes up about 4% of Down syndrome cases, can be passed down in families. We are happy to review medical records if desired to confirm which type of Down syndrome was present in this cousin.  In addition, prior Panorama testing in this pregnancy was Hammond risk for Trisomy 21, which detects >99% of babies with this condition.  We also discussed that the majority of autism cases have a multifactorial etiology resulting from a combination of genetic and environmental factors. Some cases of autism are caused by single-gene changes that can be detected using today's genetic testing technology. The most common single-gene cause of autism is Fragile X syndrome, an X-linked condition characterized by mild to moderate intellectual  disability, autism spectrum disorder, anxiety, and hyperactivity. If a single gene condition has been identified in an affected family member, then genetic testing for other family members can be made available.  The family history was otherwise unremarkable for intellectual disability, learning differences, developmental delay, birth defects, 3 or more miscarriages in any individual, stillbirths, infant deaths, or genetic conditions.  Pregnancy History This is the first pregnancy for Ms. Roxan Hockey. She reported experiencing some minor bleeding early in her pregnancy, which has since resolved. Ms. Pfisterer reported no other complications. She denies any exposures to alcohol, cigarettes, prescriptions, or other drugs. Prior Panorama screening for common chromosome conditions including Trisomy 13, 18, 21, monosomy X, triploidy was Hammond risk.  See that report for details and residual risk information.  Plan of Care: Horizon carrier screening for alpha thalassemia was ordered for Ms. Bohannon's partner, Mr. Jimmey Ralph. A saliva test kit will be shipped to Mr. Jimmey Ralph including instructions for shipping to the laboratory [name]) after collection. Ms. Boulette will be contacted by phone to discuss the results and options for next steps depending on Susan Hammond carrier status. His contact information is as follows:  Susan Hammond, dob Sep 02, 2000, phone: [phone], email: [email or web address].  We appreciate being involved in Ms. Steidle's care. We shared the phone number for our office [phone] and encouraged her to reach out with any questions or concerns.  I provided 30 minutes of non-face-to-face time during this encounter.   [name]

## 2023-03-13 NOTE — Patient Instructions (Signed)
For your constipation I recommend you continue with the MiraLAX.  You should take this every day until you have a bowel movement.  There is no limit to how much MiraLAX you can take.  I recommend starting with 2 capfuls waiting an hour if you not a bowel movement take another 1 and continue this procedure until you have a bowel movement.  If you do not have any success with MiraLAX please message me and I will send in a prescription for lactulose.

## 2023-03-15 ENCOUNTER — Ambulatory Visit (INDEPENDENT_AMBULATORY_CARE_PROVIDER_SITE_OTHER): Payer: Medicaid Other | Admitting: Family Medicine

## 2023-03-15 VITALS — BP 112/64 | HR 77 | Ht 64.0 in | Wt 135.6 lb

## 2023-03-15 DIAGNOSIS — Z3490 Encounter for supervision of normal pregnancy, unspecified, unspecified trimester: Secondary | ICD-10-CM | POA: Diagnosis not present

## 2023-03-15 NOTE — Progress Notes (Signed)
    SUBJECTIVE:   CHIEF COMPLAINT / HPI:   Here to check Fetal heart tones Seen 2 days ago for constipation and nipple discharge/galactorrhea, but FHT were not evaluated She is [redacted]w[redacted]d pregnant Denies vaginal bleeding, leakage of fluid, contractions  PERTINENT  PMH / PSH: [redacted] weeks pregnant  OBJECTIVE:   LMP 11/22/2022    General: NAD, pleasant, gravid, able to participate in exam Respiratory: No respiratory distress Skin: warm and dry, no rashes noted Psych: Normal affect and mood  Fetal heart tones appreciated, heart rate of 156  ASSESSMENT/PLAN:   Assessment & Plan Presence of fetal heart sounds, antepartum Fetal heart tones appreciated, normal rate, no concerns today.  No charge   Vonna Drafts, MD [facility]

## 2023-03-19 ENCOUNTER — Ambulatory Visit: Payer: Medicaid Other | Admitting: Advanced Practice Midwife

## 2023-03-19 ENCOUNTER — Encounter: Payer: Self-pay | Admitting: Advanced Practice Midwife

## 2023-03-19 ENCOUNTER — Other Ambulatory Visit (HOSPITAL_COMMUNITY)
Admission: RE | Admit: 2023-03-19 | Discharge: 2023-03-19 | Disposition: A | Discharge status: Home / Self Care | Payer: Medicaid Other | Source: Ambulatory Visit | Attending: Advanced Practice Midwife | Admitting: Advanced Practice Midwife

## 2023-03-19 VITALS — BP 112/69 | HR 82 | Wt 135.0 lb

## 2023-03-19 DIAGNOSIS — D563 Thalassemia minor: Secondary | ICD-10-CM | POA: Diagnosis not present

## 2023-03-19 DIAGNOSIS — Z3402 Encounter for supervision of normal first pregnancy, second trimester: Secondary | ICD-10-CM

## 2023-03-19 DIAGNOSIS — Z3480 Encounter for supervision of other normal pregnancy, unspecified trimester: Secondary | ICD-10-CM | POA: Insufficient documentation

## 2023-03-19 DIAGNOSIS — A6004 Herpesviral vulvovaginitis: Secondary | ICD-10-CM

## 2023-03-19 DIAGNOSIS — Z3A16 16 weeks gestation of pregnancy: Secondary | ICD-10-CM

## 2023-03-19 DIAGNOSIS — O926 Galactorrhea: Secondary | ICD-10-CM

## 2023-03-19 MED ORDER — ASPIRIN 81 MG PO TBEC
81.0000 mg | DELAYED_RELEASE_TABLET | Freq: Every day | ORAL | 5 refills | Status: DC
Start: 2023-03-19 — End: 2023-07-09

## 2023-03-19 NOTE — Progress Notes (Signed)
Subjective:   Susan Hammond is a 22 y.o. G1P0000 at [redacted]w[redacted]d by LMP being seen today for her first obstetrical visit.  Her obstetrical history is significant for  none, G1  and has High risk sexual behavior; Vaccination declined by patient; Cervical cancer screening; Vaginal spotting; Routine screening for STI (sexually transmitted infection); Lipoma; Mass of lower inner quadrant of right breast; Leg pain, left; Stomach pain; Dental infection; Encounter for confirmation of pregnancy test result with physical examination; Genital lesion, female; Supervision of other normal pregnancy, antepartum; Alpha thalassemia silent carrier; Constipation; and Galactorrhea during pregnancy on their problem list.. Patient does intend to breast feed. Pregnancy history fully reviewed.  Patient reports no complaints.  HISTORY: OB History  Gravida Para Term Preterm AB Living  1 0 0 0 0 0  SAB IAB Ectopic Multiple Live Births  0 0 0 0 0    # Outcome Date GA Lbr Len/2nd Weight Sex Type Anes PTL Lv  1 Current            Past Medical History:  Diagnosis Date   Medical history non-contributory    Past Surgical History:  Procedure Laterality Date   BREAST BIOPSY Right 04/23/2022   [place] RT BREAST BX W LOC DEV 1ST LESION IMG BX SPEC US GUIDE 04/23/2022 [facility]   Family History  Problem Relation Age of Onset   Healthy Mother    Healthy Father    Cancer Maternal Aunt    Diabetes Paternal Grandmother    Social History   Tobacco Use   Smoking status: Never    Passive exposure: Never   Smokeless tobacco: Never  Vaping Use   Vaping status: Never Used  Substance Use Topics   Alcohol use: No   Drug use: No   No Known Allergies Current Outpatient Medications on File Prior to Visit  Medication Sig Dispense Refill   acetaminophen (TYLENOL) 325 MG tablet Take 2 tablets (650 mg total) by mouth every 6 (six) hours as needed. (Patient not taking: Reported on 03/19/2023) 30 tablet 0   Blood  Pressure Monitoring (BLOOD PRESSURE KIT) DEVI 1 Device by Does not apply route once a week. (Patient not taking: Reported on 03/19/2023) 1 each 0   Prenatal Vit-Fe Fumarate-FA (PRENATAL VITAMINS) 28-0.8 MG TABS Take 1 tablet by mouth daily. (Patient not taking: Reported on 02/08/2023) 30 tablet 5   terconazole (TERAZOL 7) 0.4 % vaginal cream Place 1 applicator vaginally at bedtime. (Patient not taking: Reported on 03/19/2023) 45 g 0   No current facility-administered medications on file prior to visit.     Indications for ASA therapy (per uptodate) One of the following: Previous pregnancy with preeclampsia, especially early onset and with an adverse outcome No Multifetal gestation No Chronic hypertension No Type 1 or 2 diabetes mellitus No Chronic kidney disease No Autoimmune disease (antiphospholipid syndrome, systemic lupus erythematosus) No   Two or more of the following: Nulliparity Yes Obesity (body mass index >30 kg/m2) No Family history of preeclampsia in mother or sister No Age >=35 years No Sociodemographic characteristics (African American race, low socioeconomic level) Yes Personal risk factors (eg, previous pregnancy with low birth weight or small for gestational age infant, previous adverse pregnancy outcome [eg, stillbirth], interval >10 years between pregnancies) No   Indications for early 1 hour GTT (per uptodate)  BMI >25 (>23 in Asian women) AND one of the following No, BMI 23   Exam   Vitals:   03/19/23 0939  BP:  112/69  Pulse: 82  Weight: 135 lb (61.2 kg)   Fetal Heart Rate (bpm): 161  Uterus:     Pelvic Exam: Perineum: no hemorrhoids, normal perineum   Vulva: normal external genitalia, no lesions   Vagina:  normal mucosa, normal discharge   Cervix: no lesions and normal, pap smear done.    Adnexa: normal adnexa and no mass, fullness, tenderness   Bony Pelvis: average  System: General: well-developed, well-nourished female in no acute distress    Breast:  normal appearance, no masses or tenderness   Skin: normal coloration and turgor, no rashes   Neurologic: oriented, normal, negative, normal mood   Extremities: normal strength, tone, and muscle mass, ROM of all joints is normal   HEENT PERRLA, extraocular movement intact and sclera clear, anicteric   Mouth/Teeth mucous membranes moist, pharynx normal without lesions and dental hygiene good   Neck supple and no masses   Cardiovascular: regular rate and rhythm   Respiratory:  no respiratory distress, normal breath sounds   Abdomen: soft, non-tender; bowel sounds normal; no masses,  no organomegaly     Assessment:   Pregnancy: G1P0000 Patient Active Problem List   Diagnosis Date Noted   Constipation 03/13/2023   Galactorrhea during pregnancy 03/13/2023   Alpha thalassemia silent carrier 02/20/2023   Supervision of other normal pregnancy, antepartum 02/08/2023   Genital lesion, female 02/01/2023   Encounter for confirmation of pregnancy test result with physical examination 01/11/2023   Dental infection 11/08/2022   Leg pain, left 08/16/2022   Stomach pain 08/16/2022   Mass of lower inner quadrant of right breast 03/13/2022   Routine screening for STI (sexually transmitted infection) 02/02/2021   Lipoma 02/02/2021   Vaginal spotting 09/12/2020   Vaccination declined by patient 03/15/2020   Cervical cancer screening 03/15/2020   High risk sexual behavior 02/22/2020     Plan:  1. Supervision of other normal pregnancy, antepartum --Anticipatory guidance about next visits/weeks of pregnancy given.  - Pt is interested in waterbirth.  No contraindications at this time per chart review/patient assessment.   - Pt to enroll in class, see CNMs for most visits in the office.  - Discussed waterbirth as option for low-risk pregnancy.  Reviewed conditions that may arise during pregnancy that will risk pt out of waterbirth including hypertension, diabetes, fetal growth restriction  <10%ile, etc.   - Cytology - PAP( [place]) - AFP, Serum, Open Spina Bifida - Cervicovaginal ancillary only( [facility]) - aspirin EC 81 MG tablet; Take 1 tablet (81 mg total) by mouth daily. Swallow whole.  Dispense: 30 tablet; Refill: 5  2. [redacted] weeks gestation of pregnancy    3. Alpha thalassemia silent carrier    4. Galactorrhea during pregnancy --Clear drainage bilaterally.  Likely pregnancy related. Discussed physiology of pregnancy and production of colostrum. Discussed options with patient and offered diagnostic US but pt declined and will follow up if any unusual discharge occurs or breast mass found.   Initial labs reviewed Continue prenatal vitamins. Reviewed NIPS/Horizon results. AFP today Ultrasound discussed; fetal anatomic survey: ordered. Problem list reviewed and updated. The nature of [facility] with multiple MDs and other Advanced Practice Providers was explained to patient; also emphasized that residents, students are part of our team. Routine obstetric precautions reviewed. Return in about 4 weeks (around 04/16/2023) for Midwife preferred.   Sharen Counter, CNM 03/19/23 1:00 PM

## 2023-03-19 NOTE — Progress Notes (Signed)
Pt presents for NOB visit. Last PAP 2021. [place] scheduled

## 2023-03-20 LAB — CERVICOVAGINAL ANCILLARY ONLY
Chlamydia: NEGATIVE
Comment: NEGATIVE
Comment: NEGATIVE
Comment: NORMAL
Neisseria Gonorrhea: NEGATIVE
Trichomonas: NEGATIVE

## 2023-03-21 LAB — AFP, SERUM, OPEN SPINA BIFIDA
AFP MoM: 1.01
AFP Value: 43.3 ng/mL
Gest. Age on Collection Date: 16.7 wk
Maternal Age At EDD: 24.8 a
OSBR Risk 1 IN: 10000
Test Results:: NEGATIVE
Weight: 135 [lb_av]

## 2023-03-21 LAB — CYTOLOGY - PAP: Diagnosis: NEGATIVE

## 2023-03-22 ENCOUNTER — Telehealth: Payer: Self-pay

## 2023-03-22 NOTE — Telephone Encounter (Signed)
Patient calls nurse line in regards to Hepatitis B results.   She reports she is confused on results. She reports "someone" called her from the [facility] stating she was positive for Hep B.  She reports she did receive the Hep B vaccine on 8/20.  She reports she received this phone call on Wednesday. She reports she told her it appeared "we over looked" the positive result.   She reports they told her she should be retested ASAP.   Advised will send to Dr. Hyacinth Meeker.   She also has questions in regards to Aspirin. She wants to know if provider strongly feels the need for her to take this daily.   She does follow with OB however she reports difficulty getting in touch with them.

## 2023-03-28 ENCOUNTER — Encounter: Payer: Self-pay | Admitting: Family Medicine

## 2023-03-28 ENCOUNTER — Encounter: Payer: Self-pay | Admitting: *Deleted

## 2023-03-28 NOTE — Telephone Encounter (Signed)
Dr. Miquel Dunn and Jennette Kettle brought this patient's result to my attention.  The patient had a positive HBsAg result dated 01/11/23, which was initially reported to her as negative.  I contacted the patient to clarify this result. After confirming her name and date of birth, I introduced myself as the [facility] medical director, with Dr. Jennette Kettle also present on the call.  I explained the positive HBsAg finding and emphasized the need for further testing to confirm the result. I recommended that she follow up with her Obstetrician for retesting, evaluation, and management. She says she has upcoming OB appointments on 11/19 and 11/26. Dr. Jennette Kettle will reach out to her Obstetrician, Sharen Counter, before these appointments, and I will forward this note to them as well. The patient expressed her appreciation for this follow-up call.

## 2023-03-29 ENCOUNTER — Telehealth: Payer: Self-pay | Admitting: Family Medicine

## 2023-03-29 ENCOUNTER — Encounter: Payer: Self-pay | Admitting: Family Medicine

## 2023-03-29 DIAGNOSIS — B181 Chronic viral hepatitis B without delta-agent: Secondary | ICD-10-CM

## 2023-03-29 NOTE — Telephone Encounter (Signed)
Patient returns call to nurse line. Advised of message per Dr. Lum Babe.   She is requesting returned call from provider. Patient is unsure why she needs to see a specialist and is concerned that this is more serious than what was originally explained to her.   Will forward to Dr. Lum Babe.   Patient requests returned call at [phone].  Veronda Prude, RN

## 2023-03-29 NOTE — Telephone Encounter (Signed)
Unable to leave a telephone message.  I will try to contact again. If she calls, please advise her that I have placed a referral to infectious disease specialist to co-manage her positive hepatitis screening with her PCP and Obstetrician.  I will also message PCP to f/u with her regarding this information.

## 2023-03-29 NOTE — Telephone Encounter (Signed)
Called and discussed with her. All questions answered. She is fine with the referral and F/U with OB.

## 2023-03-31 ENCOUNTER — Other Ambulatory Visit: Payer: Self-pay | Admitting: Advanced Practice Midwife

## 2023-03-31 DIAGNOSIS — R768 Other specified abnormal immunological findings in serum: Secondary | ICD-10-CM

## 2023-04-01 ENCOUNTER — Telehealth: Payer: Self-pay | Admitting: Advanced Practice Midwife

## 2023-04-01 NOTE — Telephone Encounter (Signed)
-----   Message from Sharen Counter sent at 03/31/2023 12:27 AM EST ----- Recheck Hep B labs

## 2023-04-01 NOTE — Telephone Encounter (Signed)
Called patient today and verified identity using 2 markers.  Discussed pt positive Hepatitis B testing in August that has not been addressed. Consult with family practice physicians x 2 and plan to recheck panel of hepatitis labs, including retest of surface antigen, and testing of antibodies (IGG and IGM).  Pt to come in this week for this repeat testing.  Follow up to be determined based on lab results.  Pt states understanding and will come in as soon as possible for scheduled lab visit.

## 2023-04-02 ENCOUNTER — Other Ambulatory Visit: Payer: Medicaid Other

## 2023-04-02 DIAGNOSIS — R768 Other specified abnormal immunological findings in serum: Secondary | ICD-10-CM | POA: Diagnosis not present

## 2023-04-03 LAB — HCV INTERPRETATION

## 2023-04-03 LAB — ACUTE VIRAL HEPATITIS (HAV, HBV, HCV)
HCV Ab: NONREACTIVE
Hep A IgM: NEGATIVE
Hep B C IgM: NEGATIVE
Hepatitis B Surface Ag: NEGATIVE

## 2023-04-03 LAB — HEPATITIS B CORE ANTIBODY, TOTAL: Hep B Core Total Ab: NEGATIVE

## 2023-04-08 ENCOUNTER — Encounter: Payer: Self-pay | Admitting: Advanced Practice Midwife

## 2023-04-09 ENCOUNTER — Other Ambulatory Visit: Payer: Self-pay

## 2023-04-09 ENCOUNTER — Encounter: Payer: Self-pay | Admitting: *Deleted

## 2023-04-09 ENCOUNTER — Ambulatory Visit: Payer: Medicaid Other | Admitting: *Deleted

## 2023-04-09 ENCOUNTER — Ambulatory Visit: Payer: Medicaid Other | Attending: Obstetrics and Gynecology

## 2023-04-09 VITALS — BP 125/58 | HR 83

## 2023-04-09 DIAGNOSIS — Z3480 Encounter for supervision of other normal pregnancy, unspecified trimester: Secondary | ICD-10-CM

## 2023-04-09 DIAGNOSIS — Z148 Genetic carrier of other disease: Secondary | ICD-10-CM | POA: Diagnosis not present

## 2023-04-09 DIAGNOSIS — Z363 Encounter for antenatal screening for malformations: Secondary | ICD-10-CM | POA: Diagnosis not present

## 2023-04-09 DIAGNOSIS — Z3A19 19 weeks gestation of pregnancy: Secondary | ICD-10-CM | POA: Diagnosis not present

## 2023-04-10 DIAGNOSIS — Z3480 Encounter for supervision of other normal pregnancy, unspecified trimester: Secondary | ICD-10-CM | POA: Diagnosis not present

## 2023-04-16 ENCOUNTER — Ambulatory Visit: Payer: Medicaid Other | Admitting: Advanced Practice Midwife

## 2023-04-16 ENCOUNTER — Encounter: Payer: Self-pay | Admitting: Advanced Practice Midwife

## 2023-04-16 ENCOUNTER — Encounter: Payer: Medicaid Other | Admitting: Family Medicine

## 2023-04-16 VITALS — BP 105/63 | HR 90 | Wt 142.0 lb

## 2023-04-16 DIAGNOSIS — O26899 Other specified pregnancy related conditions, unspecified trimester: Secondary | ICD-10-CM

## 2023-04-16 DIAGNOSIS — Z3A20 20 weeks gestation of pregnancy: Secondary | ICD-10-CM

## 2023-04-16 DIAGNOSIS — A6004 Herpesviral vulvovaginitis: Secondary | ICD-10-CM

## 2023-04-16 DIAGNOSIS — O98312 Other infections with a predominantly sexual mode of transmission complicating pregnancy, second trimester: Secondary | ICD-10-CM

## 2023-04-16 DIAGNOSIS — R109 Unspecified abdominal pain: Secondary | ICD-10-CM

## 2023-04-16 DIAGNOSIS — O26892 Other specified pregnancy related conditions, second trimester: Secondary | ICD-10-CM

## 2023-04-16 DIAGNOSIS — Z3480 Encounter for supervision of other normal pregnancy, unspecified trimester: Secondary | ICD-10-CM

## 2023-04-16 NOTE — Progress Notes (Signed)
   PRENATAL VISIT NOTE  Subjective:  Susan Hammond is a 22 y.o. G1P0000 at [redacted]w[redacted]d being seen today for ongoing prenatal care.  She is currently monitored for the following issues for this low-risk pregnancy and has High risk sexual behavior; Cervical cancer screening; Mass of lower inner quadrant of right breast; Dental infection; Encounter for confirmation of pregnancy test result with physical examination; Genital HSV; Supervision of other normal pregnancy, antepartum; Alpha thalassemia silent carrier; Constipation; and Galactorrhea during pregnancy on their problem list.  Patient reports  episode with mild cramping a few days ago, none today .  Contractions: Not present. Vag. Bleeding: None.  Movement: Present. Denies leaking of fluid.   The following portions of the patient's history were reviewed and updated as appropriate: allergies, current medications, past family history, past medical history, past social history, past surgical history and problem list.   Objective:   Vitals:   04/16/23 1118  BP: 105/63  Pulse: 90  Weight: 142 lb (64.4 kg)    Fetal Status: Fetal Heart Rate (bpm): 152   Movement: Present     General:  Alert, oriented and cooperative. Patient is in no acute distress.  Skin: Skin is warm and dry. No rash noted.   Cardiovascular: Normal heart rate noted  Respiratory: Normal respiratory effort, no problems with respiration noted  Abdomen: Soft, gravid, appropriate for gestational age.  Pain/Pressure: Present     Pelvic: Cervical exam deferred        Extremities: Normal range of motion.  Edema: None  Mental Status: Normal mood and affect. Normal behavior. Normal judgment and thought content.   Assessment and Plan:  Pregnancy: G1P0000 at [redacted]w[redacted]d 1. Supervision of other normal pregnancy, antepartum --Anticipatory guidance about next visits/weeks of pregnancy given.  --Interest in waterbirth but not sure, pt to take class for information, discuss at future  visits  2. 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  Valtrex for prophylaxis at 34-36 weeks. Discussed with pt today who agrees with plan of care.   4. Abdominal cramping affecting pregnancy, antepartum --Pt to increase PO intake, can use heat/ice/rest/pregnancy support belt PRN. No cramping in last couple of days.     Preterm labor symptoms and general obstetric precautions including but not limited to vaginal bleeding, contractions, leaking of fluid and fetal movement were reviewed in detail with the patient. Please refer to After Visit Summary for other counseling recommendations.   Return in about 4 weeks (around 05/14/2023) for Midwife preferred, LOB.  Future Appointments  Date Time Provider Department Center  05/13/2023 11:15 AM Constant, Gigi Gin, MD [facility] None  06/10/2023  9:15 AM [facility] None  06/10/2023  9:35 AM Leftwich-Kirby, Wilmer Floor, CNM [facility] None    Sharen Counter, CNM

## 2023-04-16 NOTE — Progress Notes (Signed)
Pt presents for ROB visit. No concerns.  

## 2023-05-13 ENCOUNTER — Ambulatory Visit (INDEPENDENT_AMBULATORY_CARE_PROVIDER_SITE_OTHER): Payer: Medicaid Other | Admitting: Obstetrics and Gynecology

## 2023-05-13 ENCOUNTER — Encounter: Payer: Self-pay | Admitting: Obstetrics and Gynecology

## 2023-05-13 VITALS — BP 118/72 | HR 89 | Wt 151.0 lb

## 2023-05-13 DIAGNOSIS — Z3482 Encounter for supervision of other normal pregnancy, second trimester: Secondary | ICD-10-CM

## 2023-05-13 DIAGNOSIS — A6004 Herpesviral vulvovaginitis: Secondary | ICD-10-CM

## 2023-05-13 DIAGNOSIS — Z3480 Encounter for supervision of other normal pregnancy, unspecified trimester: Secondary | ICD-10-CM

## 2023-05-13 DIAGNOSIS — Z3A24 24 weeks gestation of pregnancy: Secondary | ICD-10-CM

## 2023-05-13 MED ORDER — PANTOPRAZOLE SODIUM 40 MG PO TBEC: 40.0000 mg | DELAYED_RELEASE_TABLET | Freq: Every day | ORAL | 3 refills | Status: DC

## 2023-05-13 MED ORDER — VITAFOL ULTRA 29-0.6-0.4-200 MG PO CAPS: 1.0000 | ORAL_CAPSULE | Freq: Every day | ORAL | 12 refills | Status: DC

## 2023-05-13 NOTE — Progress Notes (Signed)
   PRENATAL VISIT NOTE  Subjective:  Susan Hammond is a 22 y.o. G1P0000 at [redacted]w[redacted]d being seen today for ongoing prenatal care.  She is currently monitored for the following issues for this low-risk pregnancy and has High risk sexual behavior; Mass of lower inner quadrant of right breast; Dental infection; Genital HSV; Supervision of other normal pregnancy, antepartum; Alpha thalassemia silent carrier; Constipation; and Galactorrhea during pregnancy on their problem list.  Patient reports heartburn.  Contractions: Not present. Vag. Bleeding: None.  Movement: Present. Denies leaking of fluid.   The following portions of the patient's history were reviewed and updated as appropriate: allergies, current medications, past family history, past medical history, past social history, past surgical history and problem list.   Objective:   Vitals:   05/13/23 1114  BP: 118/72  Pulse: 89  Weight: 151 lb (68.5 kg)    Fetal Status: Fetal Heart Rate (bpm): 158 Fundal Height: 25 cm Movement: Present     General:  Alert, oriented and cooperative. Patient is in no acute distress.  Skin: Skin is warm and dry. No rash noted.   Cardiovascular: Normal heart rate noted  Respiratory: Normal respiratory effort, no problems with respiration noted  Abdomen: Soft, gravid, appropriate for gestational age.  Pain/Pressure: Absent     Pelvic: Cervical exam deferred        Extremities: Normal range of motion.  Edema: Trace  Mental Status: Normal mood and affect. Normal behavior. Normal judgment and thought content.   Assessment and Plan:  Pregnancy: G1P0000 at [redacted]w[redacted]d 1. Supervision of other normal pregnancy, antepartum (Primary) Patient is doing well without complaints Third trimester labs next visit Rx protonix provided to help with heartburn Peds list provided  2. Herpes simplex vulvovaginitis Suppression 34-36 weeks  Preterm labor symptoms and general obstetric precautions including but not limited to  vaginal bleeding, contractions, leaking of fluid and fetal movement were reviewed in detail with the patient. Please refer to After Visit Summary for other counseling recommendations.   Return in about 4 weeks (around 06/10/2023) for in person, ROB, Low risk, 2 hr glucola next visit.  Future Appointments  Date Time Provider Department Center  06/10/2023  9:15 AM [facility] None  06/10/2023  9:35 AM Leftwich-Kirby, Wilmer Floor, CNM [facility] None    Catalina Antigua, MD

## 2023-05-22 NOTE — L&D Delivery Note (Cosign Needed Addendum)
 Delivery Note TEKIA WATERBURY is a 23 y.o. G1P1001 at [redacted]w[redacted]d admitted for SOL.   GBS Status: Negative/-- (03/06 1714) Maximum Maternal Temperature: 99.5  Labor course: Augmentation with: AROM and Pitocin. She then progressed to complete.  ROM: 5h 34m with clear fluid  Birth: At 1523 a viable female was delivered via spontaneous vaginal delivery (Presentation: left OP ). Nuchal cord present: No.  Shoulders and body delivered in usual fashion. Infant placed directly on mom's abdomen for bonding/skin-to-skin, baby dried and stimulated. Cord clamped x 2 after 1 minute and cut by mother of patient.  Cord blood collected.  The placenta separated spontaneously and delivered via gentle cord traction.  Pitocin infused rapidly IV per protocol.  Fundus firm with massage.  Placenta inspected and appears to be intact with a 3 VC.  Placenta/Cord with the following complications: none .  Cord pH: not collected Sponge and instrument count were correct x2.  Intrapartum complications:  None Anesthesia:  epidural Episiotomy: none Lacerations:  2nd degree Suture Repair: 3.0 EBL (mL): 605   Infant: APGAR (1 MIN):  8 APGAR (5 MINS):  9  Infant weight: 3310g  Mom to postpartum.  Baby to Couplet care / Skin to Skin. Placenta to L&D   Plans to Breastfeed Contraception:  undecided; depo and POCs Circumcision: wants inpatient  Note sent to [facility]: [facility] for pp visit.  Denton Ar DO, MPH 08/19/2023 6:10 PM  GME ATTESTATION:  Evaluation and management procedures were performed by the [facility] Medicine Resident under my supervision. I was immediately available and gloved for direct supervision, assistance and direction throughout this encounter.  I also confirm that I have verified the information documented in the resident's note, and that I have also personally reperformed the pertinent components of the physical exam and all of the medical decision making activities.  I have also made any necessary  editorial changes.  Wyn Forster, MD OB Fellow, Faculty Practice [facility], Center for [facility] 08/20/2023 3:11 PM

## 2023-06-10 ENCOUNTER — Encounter: Payer: Self-pay | Admitting: Advanced Practice Midwife

## 2023-06-10 ENCOUNTER — Ambulatory Visit (INDEPENDENT_AMBULATORY_CARE_PROVIDER_SITE_OTHER): Payer: Medicaid Other | Admitting: Advanced Practice Midwife

## 2023-06-10 ENCOUNTER — Other Ambulatory Visit: Payer: Medicaid Other

## 2023-06-10 VITALS — BP 130/72 | HR 110 | Wt 150.0 lb

## 2023-06-10 DIAGNOSIS — Z3480 Encounter for supervision of other normal pregnancy, unspecified trimester: Secondary | ICD-10-CM | POA: Diagnosis not present

## 2023-06-10 DIAGNOSIS — O99891 Other specified diseases and conditions complicating pregnancy: Secondary | ICD-10-CM

## 2023-06-10 DIAGNOSIS — M549 Dorsalgia, unspecified: Secondary | ICD-10-CM

## 2023-06-10 DIAGNOSIS — Z3A28 28 weeks gestation of pregnancy: Secondary | ICD-10-CM

## 2023-06-10 DIAGNOSIS — O99893 Other specified diseases and conditions complicating puerperium: Secondary | ICD-10-CM

## 2023-06-10 NOTE — Progress Notes (Addendum)
   PRENATAL VISIT NOTE  Subjective:  Susan Hammond is a 23 y.o. G1P0000 at [redacted]w[redacted]d being seen today for ongoing prenatal care.  She is currently monitored for the following issues for this low-risk pregnancy and has High risk sexual behavior; Mass of lower inner quadrant of right breast; Dental infection; Genital HSV; Supervision of other normal pregnancy, antepartum; Alpha thalassemia silent carrier; Constipation; and Galactorrhea during pregnancy on their problem list.  Patient reports backache.  Contractions: Not present. Vag. Bleeding: None.  Movement: Present. Denies leaking of fluid.   The following portions of the patient's history were reviewed and updated as appropriate: allergies, current medications, past family history, past medical history, past social history, past surgical history and problem list.   Objective:   Vitals:   06/10/23 0932  BP: 130/72  Pulse: (!) 110  Weight: 150 lb (68 kg)    Fetal Status: Fetal Heart Rate (bpm): 153 Fundal Height: 27 cm Movement: Present     General:  Alert, oriented and cooperative. Patient is in no acute distress.  Skin: Skin is warm and dry. No rash noted.   Cardiovascular: Normal heart rate noted  Respiratory: Normal respiratory effort, no problems with respiration noted  Abdomen: Soft, gravid, appropriate for gestational age.  Pain/Pressure: Absent     Pelvic: Cervical exam deferred        Extremities: Normal range of motion.  Edema: None  Mental Status: Normal mood and affect. Normal behavior. Normal judgment and thought content.   Assessment and Plan:  Pregnancy: G1P0000 at [redacted]w[redacted]d 1. Supervision of other normal pregnancy, antepartum (Primary) --Anticipatory guidance about next visits/weeks of pregnancy given.  - Glucose Tolerance, 2 Hours w/1 Hour - RPR - CBC - HIV antibody (with reflex)  2. [redacted] weeks gestation of pregnancy  - Glucose Tolerance, 2 Hours w/1 Hour - RPR - CBC - HIV antibody (with reflex)   3. Back  pain affecting pregnancy in third trimester -Rest/ice/heat/warm bath/increase PO fluids/Tylenol/pregnancy support belt   Preterm labor symptoms and general obstetric precautions including but not limited to vaginal bleeding, contractions, leaking of fluid and fetal movement were reviewed in detail with the patient. Please refer to After Visit Summary for other counseling recommendations.   Return in about 2 weeks (around 06/24/2023) for Midwife preferred.  No future appointments.  Sharen Counter, CNM

## 2023-06-10 NOTE — Progress Notes (Signed)
Pt presents for ROB visit. Declines Tdap.

## 2023-06-10 NOTE — Progress Notes (Deleted)
Indications for ASA therapy (per uptodate) One of the following: Previous pregnancy with preeclampsia, especially early onset and with an adverse outcome {yes/no:20286} Multifetal gestation {yes/no:20286} Chronic hypertension {yes/no:20286} Type 1 or 2 diabetes mellitus {yes/no:20286} Chronic kidney disease {yes/no:20286} Autoimmune disease (antiphospholipid syndrome, systemic lupus erythematosus) {yes/no:20286}  Two or more of the following: Nulliparity {yes/no:20286} Obesity (body mass index >30 kg/m2) {yes/no:20286} Family history of preeclampsia in mother or sister {yes/no:20286} Age >=35 years {yes/no:20286} Sociodemographic characteristics (African American race, low socioeconomic level) {yes/no:20286} Personal risk factors (eg, previous pregnancy with low birth weight or small for gestational age infant, previous adverse pregnancy outcome [eg, stillbirth], interval >10 years between pregnancies) {yes/no:20286}  Indications for early GDM screening  First-degree relative with diabetes {yes/no:20286} BMI >30kg/m2 {yes/no:20286} Age > 35 {yes/no:20286} Previous birth of an infant weighing >=4000 g {yes/no:20286} Gestational diabetes mellitus in a previous pregnancy {yes/no:20286} Glycated hemoglobin >=5.7 percent (39 mmol/mol), impaired glucose tolerance, or impaired fasting glucose on previous testing {yes/no:20286} High-risk race/ethnicity (eg, African American, Latino, Native American, [place] American, [place] Islander) {yes/no:20286} Previous stillbirth of unknown cause {yes/no:20286} Maternal birthweight > 9 lbs {yes/no:20286} History of cardiovascular disease {yes/no:20286} Hypertension or on therapy for hypertension {yes/no:20286} High-density lipoprotein cholesterol level <35 mg/dL (3.23 mmol/L) and/or a triglyceride level >250 mg/dL (5.57 mmol/L) {DUK/GU:54270} Polycystic ovary syndrome {yes/no:20286} Physical inactivity {yes/no:20286} Other clinical condition associated  with insulin resistance (eg, severe obesity, acanthosis nigricans) {yes/no:20286} Current use of glucocorticoids {yes/no:20286}   Early screening tests: FBS, A1C, Random CBG, glucose challenge

## 2023-06-11 LAB — RPR: RPR Ser Ql: NONREACTIVE

## 2023-06-11 LAB — CBC
Hematocrit: 24.7 % — ABNORMAL LOW (ref 34.0–46.6)
Hemoglobin: 7.3 g/dL — ABNORMAL LOW (ref 11.1–15.9)
MCH: 18.7 pg — ABNORMAL LOW (ref 26.6–33.0)
MCHC: 29.6 g/dL — ABNORMAL LOW (ref 31.5–35.7)
MCV: 63 fL — ABNORMAL LOW (ref 79–97)
Platelets: 219 10*3/uL (ref 150–450)
RBC: 3.91 x10E6/uL (ref 3.77–5.28)
RDW: 16.6 % — ABNORMAL HIGH (ref 11.7–15.4)
WBC: 13.6 10*3/uL — ABNORMAL HIGH (ref 3.4–10.8)

## 2023-06-11 LAB — GLUCOSE TOLERANCE, 2 HOURS W/ 1HR
Glucose, 1 hour: 149 mg/dL (ref 70–179)
Glucose, 2 hour: 136 mg/dL (ref 70–152)
Glucose, Fasting: 74 mg/dL (ref 70–91)

## 2023-06-11 LAB — HIV ANTIBODY (ROUTINE TESTING W REFLEX): HIV Screen 4th Generation wRfx: NONREACTIVE

## 2023-06-14 ENCOUNTER — Telehealth: Payer: Self-pay

## 2023-06-14 ENCOUNTER — Other Ambulatory Visit: Payer: Self-pay | Admitting: Advanced Practice Midwife

## 2023-06-14 DIAGNOSIS — O99013 Anemia complicating pregnancy, third trimester: Secondary | ICD-10-CM

## 2023-06-14 DIAGNOSIS — O99019 Anemia complicating pregnancy, unspecified trimester: Secondary | ICD-10-CM | POA: Insufficient documentation

## 2023-06-14 MED ORDER — ACCRUFER 30 MG PO CAPS: 1.0000 | ORAL_CAPSULE | Freq: Two times a day (BID) | ORAL | 5 refills | Status: DC | PRN

## 2023-06-14 NOTE — Progress Notes (Signed)
IV Venofer infusions ordered.  My Chart message sent to patient.

## 2023-06-14 NOTE — Telephone Encounter (Signed)
Susan Hammond, patient will be scheduled as soon as possible.  Auth Submission: NO AUTH NEEDED Site of care: Site of care: [facility] Payer: [employer] Medication & CPT/J Code(s) submitted: Venofer (Iron Sucrose) J1756 Route of submission (phone, fax, portal):  Phone # Fax # Auth type: Buy/Bill PB Units/visits requested: 200mg  x 5 doses Reference number:  Approval from: 06/14/23 to 11/12/23

## 2023-06-14 NOTE — Addendum Note (Signed)
Addended by: Sharen Counter A on: 06/14/2023 07:17 AM   Modules accepted: Orders

## 2023-06-18 ENCOUNTER — Ambulatory Visit (INDEPENDENT_AMBULATORY_CARE_PROVIDER_SITE_OTHER): Payer: Medicaid Other | Admitting: *Deleted

## 2023-06-18 VITALS — BP 108/67 | HR 89 | Temp 98.1°F | Resp 18 | Ht 62.0 in | Wt 152.6 lb

## 2023-06-18 DIAGNOSIS — D508 Other iron deficiency anemias: Secondary | ICD-10-CM

## 2023-06-18 DIAGNOSIS — O99013 Anemia complicating pregnancy, third trimester: Secondary | ICD-10-CM

## 2023-06-18 DIAGNOSIS — Z3A29 29 weeks gestation of pregnancy: Secondary | ICD-10-CM | POA: Diagnosis not present

## 2023-06-18 MED ORDER — ACETAMINOPHEN 325 MG PO TABS
650.0000 mg | ORAL_TABLET | Freq: Once | ORAL | Status: AC
Start: 2023-06-18 — End: 2023-06-18
  Administered 2023-06-18: 650 mg via ORAL
  Filled 2023-06-18: qty 2

## 2023-06-18 MED ORDER — IRON SUCROSE 20 MG/ML IV SOLN
200.0000 mg | Freq: Once | INTRAVENOUS | Status: AC
Start: 2023-06-18 — End: 2023-06-18
  Administered 2023-06-18: 200 mg via INTRAVENOUS
  Filled 2023-06-18: qty 10

## 2023-06-18 MED ORDER — DIPHENHYDRAMINE HCL 25 MG PO CAPS
25.0000 mg | ORAL_CAPSULE | Freq: Once | ORAL | Status: AC
Start: 2023-06-18 — End: 2023-06-18
  Administered 2023-06-18: 25 mg via ORAL
  Filled 2023-06-18: qty 1

## 2023-06-18 NOTE — Progress Notes (Signed)
Diagnosis: Iron Deficiency Anemia  Provider:  Chilton Greathouse MD  Procedure: IV Push  IV Type: Peripheral, IV Location: L Antecubital  Venofer (Iron Sucrose), Dose: 200 mg  Post Infusion IV Care: Observation period completed and Peripheral IV Discontinued  Discharge: Condition: Good, Destination: Home . AVS Provided  Performed by:  Rico Ala, LPN

## 2023-06-20 ENCOUNTER — Ambulatory Visit (INDEPENDENT_AMBULATORY_CARE_PROVIDER_SITE_OTHER): Payer: Medicaid Other

## 2023-06-20 VITALS — BP 112/70 | HR 100 | Temp 98.2°F | Resp 16 | Ht 62.0 in | Wt 152.8 lb

## 2023-06-20 DIAGNOSIS — Z3A30 30 weeks gestation of pregnancy: Secondary | ICD-10-CM

## 2023-06-20 DIAGNOSIS — D508 Other iron deficiency anemias: Secondary | ICD-10-CM | POA: Diagnosis not present

## 2023-06-20 DIAGNOSIS — O99013 Anemia complicating pregnancy, third trimester: Secondary | ICD-10-CM | POA: Diagnosis not present

## 2023-06-20 MED ORDER — DIPHENHYDRAMINE HCL 25 MG PO CAPS: 25.0000 mg | ORAL_CAPSULE | Freq: Once | ORAL | Status: DC

## 2023-06-20 MED ORDER — IRON SUCROSE 20 MG/ML IV SOLN
200.0000 mg | Freq: Once | INTRAVENOUS | Status: AC
Start: 2023-06-20 — End: 2023-06-20
  Administered 2023-06-20: 200 mg via INTRAVENOUS
  Filled 2023-06-20: qty 10

## 2023-06-20 MED ORDER — ACETAMINOPHEN 325 MG PO TABS: 650.0000 mg | ORAL_TABLET | Freq: Once | ORAL | Status: DC

## 2023-06-20 NOTE — Progress Notes (Signed)
Diagnosis: Iron Deficiency Anemia  Provider:  Chilton Greathouse MD  Procedure: IV Push  IV Type: Peripheral, IV Location: R Antecubital  Venofer (Iron Sucrose), Dose: 200 mg  Post Infusion IV Care: Observation period completed and Peripheral IV Discontinued  Discharge: Condition: Good, Destination: Home . AVS Declined  Performed by:  Adriana Mccallum, RN    Patient refused pre-medications. Nurse educated patient and stressed the importance of taking pre-medications as a precaution in the event of a medication reaction. Patient verbalized understanding.

## 2023-06-25 ENCOUNTER — Encounter: Payer: Medicaid Other | Admitting: Obstetrics & Gynecology

## 2023-06-25 ENCOUNTER — Ambulatory Visit: Payer: Medicaid Other

## 2023-06-25 VITALS — BP 112/69 | HR 92 | Temp 98.4°F | Resp 18 | Ht 62.0 in | Wt 155.6 lb

## 2023-06-25 DIAGNOSIS — Z3A30 30 weeks gestation of pregnancy: Secondary | ICD-10-CM | POA: Diagnosis not present

## 2023-06-25 DIAGNOSIS — O99013 Anemia complicating pregnancy, third trimester: Secondary | ICD-10-CM | POA: Diagnosis not present

## 2023-06-25 DIAGNOSIS — D508 Other iron deficiency anemias: Secondary | ICD-10-CM | POA: Diagnosis not present

## 2023-06-25 MED ORDER — ACETAMINOPHEN 325 MG PO TABS
650.0000 mg | ORAL_TABLET | Freq: Once | ORAL | Status: DC
Start: 2023-06-25 — End: 2023-06-25
  Filled 2023-06-25: qty 2

## 2023-06-25 MED ORDER — DIPHENHYDRAMINE HCL 25 MG PO CAPS
25.0000 mg | ORAL_CAPSULE | Freq: Once | ORAL | Status: DC
  Filled 2023-06-25: qty 1

## 2023-06-25 MED ORDER — SODIUM CHLORIDE 0.9 % IV BOLUS
250.0000 mL | Freq: Once | INTRAVENOUS | Status: DC
  Filled 2023-06-25: qty 250

## 2023-06-25 MED ORDER — IRON SUCROSE 20 MG/ML IV SOLN
200.0000 mg | Freq: Once | INTRAVENOUS | Status: AC
Start: 2023-06-25 — End: 2023-06-25
  Administered 2023-06-25: 200 mg via INTRAVENOUS
  Filled 2023-06-25: qty 10

## 2023-06-25 NOTE — Progress Notes (Signed)
 Diagnosis: Iron  Deficiency Anemia  Provider:  Praveen Mannam MD  Procedure: IV Push  IV Type: Peripheral, IV Location: L Forearm  Venofer  (Iron  Sucrose), Dose: 200 mg  Post Infusion IV Care: Patient declined observation  Discharge: Condition: Good, Destination: Home . AVS Declined  Performed by:  Court Gracia, RN

## 2023-06-27 ENCOUNTER — Ambulatory Visit: Payer: Medicaid Other

## 2023-06-27 VITALS — BP 107/70 | HR 92 | Temp 98.2°F | Resp 18 | Ht 62.0 in | Wt 154.4 lb

## 2023-06-27 DIAGNOSIS — Z3A31 31 weeks gestation of pregnancy: Secondary | ICD-10-CM | POA: Diagnosis not present

## 2023-06-27 DIAGNOSIS — D508 Other iron deficiency anemias: Secondary | ICD-10-CM | POA: Diagnosis not present

## 2023-06-27 DIAGNOSIS — O99013 Anemia complicating pregnancy, third trimester: Secondary | ICD-10-CM | POA: Diagnosis not present

## 2023-06-27 MED ORDER — SODIUM CHLORIDE 0.9 % IV BOLUS
250.0000 mL | Freq: Once | INTRAVENOUS | Status: DC
  Filled 2023-06-27: qty 250

## 2023-06-27 MED ORDER — IRON SUCROSE 20 MG/ML IV SOLN
200.0000 mg | Freq: Once | INTRAVENOUS | Status: AC
  Administered 2023-06-27: 200 mg via INTRAVENOUS
  Filled 2023-06-27: qty 10

## 2023-06-27 MED ORDER — ACETAMINOPHEN 325 MG PO TABS: 650.0000 mg | ORAL_TABLET | Freq: Once | ORAL | Status: DC

## 2023-06-27 MED ORDER — DIPHENHYDRAMINE HCL 25 MG PO CAPS: 25.0000 mg | ORAL_CAPSULE | Freq: Once | ORAL | Status: DC

## 2023-06-27 NOTE — Progress Notes (Signed)
 Diagnosis: Iron Deficiency Anemia  Provider:  Chilton Greathouse MD  Procedure: IV Push  IV Type: Peripheral, IV Location: R Antecubital  Venofer (Iron Sucrose), Dose: 200 mg  Post Infusion IV Care: Patient declined observation and Peripheral IV Discontinued  Discharge: Condition: Good, Destination: Home . AVS Declined  Performed by:  Adriana Mccallum, RN    Patient refused pre-medications. Nurse educated patient and stressed the importance of taking pre-medications as a precaution in the event of a medication reaction. Patient verbalized understanding.

## 2023-07-02 ENCOUNTER — Ambulatory Visit: Payer: Medicaid Other

## 2023-07-02 VITALS — BP 106/63 | HR 94 | Temp 98.0°F | Resp 18 | Ht 62.0 in | Wt 157.4 lb

## 2023-07-02 DIAGNOSIS — O99013 Anemia complicating pregnancy, third trimester: Secondary | ICD-10-CM

## 2023-07-02 DIAGNOSIS — Z3A31 31 weeks gestation of pregnancy: Secondary | ICD-10-CM

## 2023-07-02 DIAGNOSIS — D508 Other iron deficiency anemias: Secondary | ICD-10-CM

## 2023-07-02 MED ORDER — IRON SUCROSE 20 MG/ML IV SOLN
200.0000 mg | Freq: Once | INTRAVENOUS | Status: AC
Start: 2023-07-02 — End: 2023-07-02
  Administered 2023-07-02: 200 mg via INTRAVENOUS
  Filled 2023-07-02: qty 10

## 2023-07-02 MED ORDER — DIPHENHYDRAMINE HCL 25 MG PO CAPS: 25.0000 mg | ORAL_CAPSULE | Freq: Once | ORAL | Status: DC

## 2023-07-02 MED ORDER — ACETAMINOPHEN 325 MG PO TABS: 650.0000 mg | ORAL_TABLET | Freq: Once | ORAL | Status: DC

## 2023-07-02 MED ORDER — SODIUM CHLORIDE 0.9 % IV BOLUS
250.0000 mL | Freq: Once | INTRAVENOUS | Status: DC
  Filled 2023-07-02: qty 250

## 2023-07-02 NOTE — Progress Notes (Signed)
Diagnosis: Iron Deficiency Anemia  Provider:  Chilton Greathouse MD  Procedure: IV Push  IV Type: Peripheral, IV Location: L Forearm  Venofer (Iron Sucrose), Dose: 200 mg  Post Infusion IV Care: Patient declined observation and Peripheral IV Discontinued  Discharge: Condition: Good, Destination: Home . AVS Declined  Performed by:  Adriana Mccallum, RN   Patient refused pre-medications. Nurse educated patient and stressed the importance of taking pre-medications as a precaution in the event of a medication reaction. Patient verbalized understanding.

## 2023-07-03 NOTE — Progress Notes (Signed)
Please see the genetic counseling note on 10/211/2024 for documentation. I provided general supervision for this patient and was immediately available for any patient care concerns. Manpreet Kemmer [employer]

## 2023-07-09 ENCOUNTER — Encounter: Payer: Self-pay | Admitting: Advanced Practice Midwife

## 2023-07-09 ENCOUNTER — Ambulatory Visit: Payer: Medicaid Other | Admitting: Advanced Practice Midwife

## 2023-07-09 VITALS — BP 124/66 | HR 102 | Wt 156.4 lb

## 2023-07-09 DIAGNOSIS — D508 Other iron deficiency anemias: Secondary | ICD-10-CM

## 2023-07-09 DIAGNOSIS — Z3A32 32 weeks gestation of pregnancy: Secondary | ICD-10-CM

## 2023-07-09 DIAGNOSIS — Z3480 Encounter for supervision of other normal pregnancy, unspecified trimester: Secondary | ICD-10-CM

## 2023-07-09 MED ORDER — ACCRUFER 30 MG PO CAPS: 1.0000 | ORAL_CAPSULE | Freq: Two times a day (BID) | ORAL | 5 refills | Status: AC

## 2023-07-09 NOTE — Progress Notes (Signed)
   PRENATAL VISIT NOTE  Subjective:  Susan Hammond is a 23 y.o. G1P0000 at [redacted]w[redacted]d being seen today for ongoing prenatal care.  She is currently monitored for the following issues for this low-risk pregnancy and has High risk sexual behavior; Mass of lower inner quadrant of right breast; Dental infection; Genital HSV; Supervision of other normal pregnancy, antepartum; Alpha thalassemia silent carrier; Constipation; Galactorrhea during pregnancy; and Anemia complicating pregnancy on their problem list.  Patient reports no complaints.  Contractions: Not present. Vag. Bleeding: None.  Movement: Present. Denies leaking of fluid.   The following portions of the patient's history were reviewed and updated as appropriate: allergies, current medications, past family history, past medical history, past social history, past surgical history and problem list.   Objective:   Vitals:   07/09/23 1137  BP: 124/66  Pulse: (!) 102  Weight: 156 lb 6.4 oz (70.9 kg)    Fetal Status: Fetal Heart Rate (bpm): 154   Movement: Present     General:  Alert, oriented and cooperative. Patient is in no acute distress.  Skin: Skin is warm and dry. No rash noted.   Cardiovascular: Normal heart rate noted  Respiratory: Normal respiratory effort, no problems with respiration noted  Abdomen: Soft, gravid, appropriate for gestational age.  Pain/Pressure: Absent     Pelvic: Cervical exam deferred        Extremities: Normal range of motion.  Edema: None  Mental Status: Normal mood and affect. Normal behavior. Normal judgment and thought content.   Assessment and Plan:  Pregnancy: G1P0000 at 107w5d 1. Supervision of other normal pregnancy, antepartum --Anticipatory guidance about next visits/weeks of pregnancy given.   2. Other iron deficiency anemia (Primary) --RX for Accrufer but cost was $200.  Resent Rx to [facility] mail order pharmacy to work on prior [place]. --No s/sx of anemia  3. [redacted] weeks gestation of  pregnancy   Preterm labor symptoms and general obstetric precautions including but not limited to vaginal bleeding, contractions, leaking of fluid and fetal movement were reviewed in detail with the patient. Please refer to After Visit Summary for other counseling recommendations.   No follow-ups on file.  Future Appointments  Date Time Provider Department Center  07/24/2023 11:15 AM Sue Lush, FNP [facility] None    Sharen Counter, CNM

## 2023-07-09 NOTE — Progress Notes (Signed)
Problems with insurance covering Fe tabs. Not taking at this time.

## 2023-07-24 ENCOUNTER — Encounter: Payer: Medicaid Other | Admitting: Obstetrics and Gynecology

## 2023-07-24 ENCOUNTER — Telehealth: Payer: Self-pay

## 2023-07-24 NOTE — Telephone Encounter (Signed)
 Returned call, pt states that she has been having vaginal discharge with a light green/yellow color, reports occasional cramping and back pain. Pt has appt on 3/7, advised can be swabbed for infections at that appt, or will call if we have an earlier appt available, pt agreed.

## 2023-07-25 ENCOUNTER — Ambulatory Visit: Admitting: Nurse Practitioner

## 2023-07-25 ENCOUNTER — Other Ambulatory Visit (HOSPITAL_COMMUNITY)
Admission: RE | Admit: 2023-07-25 | Discharge: 2023-07-25 | Disposition: A | Discharge status: Home / Self Care | Source: Ambulatory Visit | Attending: Nurse Practitioner | Admitting: Nurse Practitioner

## 2023-07-25 VITALS — BP 137/71 | HR 100 | Wt 163.8 lb

## 2023-07-25 DIAGNOSIS — N760 Acute vaginitis: Secondary | ICD-10-CM

## 2023-07-25 DIAGNOSIS — N898 Other specified noninflammatory disorders of vagina: Secondary | ICD-10-CM | POA: Insufficient documentation

## 2023-07-25 DIAGNOSIS — O26899 Other specified pregnancy related conditions, unspecified trimester: Secondary | ICD-10-CM

## 2023-07-25 DIAGNOSIS — B9689 Other specified bacterial agents as the cause of diseases classified elsewhere: Secondary | ICD-10-CM

## 2023-07-25 DIAGNOSIS — Z3A35 35 weeks gestation of pregnancy: Secondary | ICD-10-CM | POA: Diagnosis not present

## 2023-07-25 DIAGNOSIS — R102 Pelvic and perineal pain unspecified side: Secondary | ICD-10-CM

## 2023-07-25 DIAGNOSIS — O26893 Other specified pregnancy related conditions, third trimester: Secondary | ICD-10-CM

## 2023-07-25 DIAGNOSIS — D508 Other iron deficiency anemias: Secondary | ICD-10-CM | POA: Diagnosis not present

## 2023-07-25 NOTE — Progress Notes (Signed)
 Pt presents for rob. Pt is having trouble getting her prenatals and Iron pills.

## 2023-07-25 NOTE — Progress Notes (Unsigned)
   PRENATAL VISIT NOTE  Subjective:  Susan Hammond is a 23 y.o. G1P0000 at [redacted]w[redacted]d being seen today for ongoing prenatal care.  She is currently monitored for the following issues for this low-risk pregnancy and has High risk sexual behavior; Mass of lower inner quadrant of right breast; Dental infection; Genital HSV; Supervision of other normal pregnancy, antepartum; Alpha thalassemia silent carrier; Constipation; Galactorrhea during pregnancy; and Anemia complicating pregnancy on their problem list.  Patient reports  some increased vaginal discharge and mild dysuria requests cervical exam .  Contractions: Not present. Vag. Bleeding: None.  Movement: Present. Denies leaking of fluid.   The following portions of the patient's history were reviewed and updated as appropriate: allergies, current medications, past family history, past medical history, past social history, past surgical history and problem list.   Objective:   Vitals:   07/25/23 1638  BP: 137/71  Pulse: 100  Weight: 163 lb 12.8 oz (74.3 kg)    Fetal Status: Fetal Heart Rate (bpm): 144 Fundal Height: 35 cm Movement: Present     General:  Alert, oriented and cooperative. Patient is in no acute distress.  Skin: Skin is warm and dry. No rash noted.   Cardiovascular: Normal heart rate noted  Respiratory: Normal respiratory effort, no problems with respiration noted  Abdomen: Soft, gravid, appropriate for gestational age.  Pain/Pressure: Present     Pelvic: Cervical exam performed in the presence of a chaperone      Blind swabs collected with GBS, Declined cervical check d/t discomfort  Extremities: Normal range of motion.  Edema: None  Mental Status: Normal mood and affect. Normal behavior. Normal judgment and thought content.   Assessment and Plan:  Pregnancy: G1P0000 at [redacted]w[redacted]d   1. Vaginal discharge during pregnancy in third trimester (Primary)  - Cervicovaginal ancillary only( [place]) - Urine Culture  2. [redacted]  weeks gestation of pregnancy - Doing well , FHR appropriate  - Cervicovaginal ancillary only( [place]) - Culture, beta strep (group b only) - Urine Culture  3. Pelvic pressure in pregnancy  - Cervicovaginal ancillary only( [place]) - Culture, beta strep (group b only) - Urine Culture  - Cervical exam declined by patient d/t discomfort  4. Other iron deficiency anemia -RX  previously sent for Accrufer (Not covered by insurance) Resent to [facility] mail order per 07/09/23 Note  - Patient hasn't received iron supplements yet advised Dietary changes and offered RX for Ferrous Sulfate ; patient awaiting on prior auth    Return in about 2 weeks (around 08/08/2023) for LOB.  Future Appointments  Date Time Provider Department Center  08/01/2023  4:10 PM Ralene Muskrat, PA-C [facility] None   Preterm labor symptoms and general obstetric precautions including but not limited to vaginal bleeding, contractions, leaking of fluid and fetal movement were reviewed in detail with the patient. Please refer to After Visit Summary for other counseling recommendations.   Colman Cater, NP

## 2023-07-26 ENCOUNTER — Encounter: Admitting: Obstetrics and Gynecology

## 2023-07-27 LAB — URINE CULTURE

## 2023-07-29 ENCOUNTER — Encounter: Payer: Self-pay | Admitting: Nurse Practitioner

## 2023-07-29 LAB — CERVICOVAGINAL ANCILLARY ONLY
Bacterial Vaginitis (gardnerella): POSITIVE — AB
Candida Glabrata: NEGATIVE
Candida Vaginitis: NEGATIVE
Chlamydia: NEGATIVE
Comment: NEGATIVE
Comment: NEGATIVE
Comment: NEGATIVE
Comment: NEGATIVE
Comment: NEGATIVE
Comment: NORMAL
Neisseria Gonorrhea: NEGATIVE
Trichomonas: NEGATIVE

## 2023-07-29 LAB — CULTURE, BETA STREP (GROUP B ONLY): Strep Gp B Culture: NEGATIVE

## 2023-07-29 MED ORDER — METRONIDAZOLE 500 MG PO TABS: 500.0000 mg | ORAL_TABLET | Freq: Two times a day (BID) | ORAL | 0 refills | Status: AC

## 2023-07-29 NOTE — Addendum Note (Signed)
 Addended by: Marcell Barlow on: 07/29/2023 10:37 PM   Modules accepted: Orders

## 2023-07-31 NOTE — Progress Notes (Deleted)
   PRENATAL VISIT NOTE  Subjective:  Susan Hammond is a 23 y.o. G1P0000 at [redacted]w[redacted]d being seen today for ongoing prenatal care.  She is currently monitored for the following issues for this {Blank single:19197::"high-risk","low-risk"} pregnancy and has High risk sexual behavior; Mass of lower inner quadrant of right breast; Dental infection; Genital HSV; Supervision of other normal pregnancy, antepartum; Alpha thalassemia silent carrier; Constipation; Galactorrhea during pregnancy; and Anemia complicating pregnancy on their problem list.  Patient reports {sx:14538}.   .  .   . Denies leaking of fluid.   The following portions of the patient's history were reviewed and updated as appropriate: allergies, current medications, past family history, past medical history, past social history, past surgical history and problem list.   Objective:  There were no vitals filed for this visit.  Fetal Status:           General:  Alert, oriented and cooperative. Patient is in no acute distress.  Skin: Skin is warm and dry. No rash noted.   Cardiovascular: Normal heart rate noted  Respiratory: Normal respiratory effort, no problems with respiration noted  Abdomen: Soft, gravid, appropriate for gestational age.        Pelvic: {Blank single:19197::"Cervical exam performed in the presence of a chaperone","Cervical exam deferred"}        Extremities: Normal range of motion.     Mental Status: Normal mood and affect. Normal behavior. Normal judgment and thought content.   Assessment and Plan:  Pregnancy: G1P0000 at [redacted]w[redacted]d  1. Supervision of other normal pregnancy, antepartum (Primary) Patient is doing well, feeling regular fetal movement BP, FHR, FH appropriate  2. [redacted] weeks gestation of pregnancy Anticipatory guidance about next visits/weeks of pregnancy given.   3. Genital herpes simplex, unspecified site Valtrex ***  4. Anemia affecting pregnancy in third trimester Hgb 7.3 one month ago on  Ferric Maltol twice daily x 4 weeks  Preterm labor symptoms and general obstetric precautions including but not limited to vaginal bleeding, contractions, leaking of fluid and fetal movement were reviewed in detail with the patient.  Please refer to After Visit Summary for other counseling recommendations.   No follow-ups on file.  Future Appointments  Date Time Provider Department Center  08/01/2023  4:10 PM Ralene Muskrat, PA-C [facility] None    Ralene Muskrat, [place]

## 2023-08-01 ENCOUNTER — Encounter: Admitting: Physician Assistant

## 2023-08-01 DIAGNOSIS — A6000 Herpesviral infection of urogenital system, unspecified: Secondary | ICD-10-CM

## 2023-08-01 DIAGNOSIS — O99013 Anemia complicating pregnancy, third trimester: Secondary | ICD-10-CM

## 2023-08-01 DIAGNOSIS — Z3480 Encounter for supervision of other normal pregnancy, unspecified trimester: Secondary | ICD-10-CM

## 2023-08-01 DIAGNOSIS — Z3A36 36 weeks gestation of pregnancy: Secondary | ICD-10-CM

## 2023-08-05 ENCOUNTER — Telehealth: Payer: Self-pay | Admitting: Family Medicine

## 2023-08-05 ENCOUNTER — Encounter: Payer: Self-pay | Admitting: Obstetrics and Gynecology

## 2023-08-05 NOTE — Telephone Encounter (Signed)
 Patient stated she needs  a letter saying it is ok if she does not get the flu shot. Needed for school.

## 2023-08-09 ENCOUNTER — Telehealth: Payer: Self-pay | Admitting: Pharmacy Technician

## 2023-08-09 ENCOUNTER — Ambulatory Visit (INDEPENDENT_AMBULATORY_CARE_PROVIDER_SITE_OTHER): Admitting: Obstetrics

## 2023-08-09 ENCOUNTER — Encounter: Payer: Self-pay | Admitting: Family Medicine

## 2023-08-09 VITALS — BP 135/74 | HR 102 | Wt 166.5 lb

## 2023-08-09 DIAGNOSIS — Z3A37 37 weeks gestation of pregnancy: Secondary | ICD-10-CM

## 2023-08-09 DIAGNOSIS — K219 Gastro-esophageal reflux disease without esophagitis: Secondary | ICD-10-CM

## 2023-08-09 DIAGNOSIS — Z3480 Encounter for supervision of other normal pregnancy, unspecified trimester: Secondary | ICD-10-CM

## 2023-08-09 DIAGNOSIS — J301 Allergic rhinitis due to pollen: Secondary | ICD-10-CM | POA: Diagnosis not present

## 2023-08-09 DIAGNOSIS — O99013 Anemia complicating pregnancy, third trimester: Secondary | ICD-10-CM

## 2023-08-09 MED ORDER — LORATADINE 10 MG PO TABS: 10.0000 mg | ORAL_TABLET | Freq: Every day | ORAL | 11 refills | Status: AC

## 2023-08-09 MED ORDER — PNV-DHA+DOCUSATE 27-1.25-300 MG PO CAPS: 1.0000 | ORAL_CAPSULE | Freq: Every day | ORAL | 4 refills | Status: AC

## 2023-08-09 MED ORDER — PANTOPRAZOLE SODIUM 40 MG PO TBEC: 40.0000 mg | DELAYED_RELEASE_TABLET | Freq: Two times a day (BID) | ORAL | 5 refills | Status: AC

## 2023-08-09 NOTE — Telephone Encounter (Signed)
 Auth Submission: NO AUTH NEEDED Site of care: Site of care: [facility] Payer: [employer] Medication & CPT/J Code(s) submitted: Venofer (Iron Sucrose) J1756 Route of submission (phone, fax, portal):  Phone # Fax # Auth type: Buy/Bill PB Units/visits requested: 2 doses Reference number:  Approval from: 08/09/23 to 01/09/24

## 2023-08-09 NOTE — Progress Notes (Signed)
 Still problems getting Prenatals. Insurance issue. Not taking anythingl.

## 2023-08-09 NOTE — Progress Notes (Signed)
 Still not taking prenatals due to insurance problem. Issues with reflux.

## 2023-08-09 NOTE — Progress Notes (Signed)
 Subjective:  Susan Hammond is a 23 y.o. G1P0000 at [redacted]w[redacted]d being seen today for ongoing prenatal care.  She is currently monitored for the following issues for this low-risk pregnancy and has High risk sexual behavior; Mass of lower inner quadrant of right breast; Dental infection; Genital HSV; Supervision of other normal pregnancy, antepartum; Alpha thalassemia silent carrier; Constipation; Galactorrhea during pregnancy; and Anemia complicating pregnancy on their problem list.  Patient reports heartburn and seasonal allergies .  Contractions: Not present. Vag. Bleeding: None.  Movement: Present. Denies leaking of fluid.   The following portions of the patient's history were reviewed and updated as appropriate: allergies, current medications, past family history, past medical history, past social history, past surgical history and problem list. Problem list updated.  Objective:   Vitals:   08/09/23 1131  BP: 135/74  Pulse: (!) 102  Weight: 166 lb 8 oz (75.5 kg)    Fetal Status: Fetal Heart Rate (bpm): 154   Movement: Present     General:  Alert, oriented and cooperative. Patient is in no acute distress.  Skin: Skin is warm and dry. No rash noted.   Cardiovascular: Normal heart rate noted  Respiratory: Normal respiratory effort, no problems with respiration noted  Abdomen: Soft, gravid, appropriate for gestational age. Pain/Pressure: Absent     Pelvic:  Cervical exam deferred        Extremities: Normal range of motion.  Edema: None  Mental Status: Normal mood and affect. Normal behavior. Normal judgment and thought content.   Urinalysis:      Assessment and Plan:  Pregnancy: G1P0000 at [redacted]w[redacted]d  1. Supervision of other normal pregnancy, antepartum (Primary) Rx: - Prenat-FeFum-DSS-FA-DHA w/o A (PNV-DHA+DOCUSATE) 27-1.25-300 MG CAPS; Take 1 capsule by mouth daily before breakfast.  Dispense: 90 capsule; Refill: 4  2. Anemia affecting pregnancy in third trimester, severe - Accrufer  Rx - she has had 5 iron infusions, last infusion 3-4 weeks ago  3. GERD without esophagitis Rx: - pantoprazole (PROTONIX) 40 MG tablet; Take 1 tablet (40 mg total) by mouth 2 (two) times daily before a meal.  Dispense: 60 tablet; Refill: 5  4. Seasonal allergic rhinitis due to pollen Rx: - loratadine (CLARITIN) 10 MG tablet; Take 1 tablet (10 mg total) by mouth daily.  Dispense: 30 tablet; Refill: 11  Term labor symptoms and general obstetric precautions including but not limited to vaginal bleeding, contractions, leaking of fluid and fetal movement were reviewed in detail with the patient. Please refer to After Visit Summary for other counseling recommendations.   Return in about 1 week (around 08/16/2023) for ROB.   Brock Bad, MD 08/09/2023

## 2023-08-12 ENCOUNTER — Other Ambulatory Visit: Payer: Self-pay | Admitting: *Deleted

## 2023-08-12 DIAGNOSIS — Z3480 Encounter for supervision of other normal pregnancy, unspecified trimester: Secondary | ICD-10-CM

## 2023-08-12 MED ORDER — PRENATAL VITAMINS 28-0.8 MG PO TABS: 1.0000 | ORAL_TABLET | Freq: Every day | ORAL | 5 refills | Status: AC

## 2023-08-12 NOTE — Progress Notes (Signed)
 RX Prenat-FeFum-DSS-FA-DHA w/o A (PNV-DHA+DOCUSATE) 27-1.25-300 MG CAPS Prenatal Vit-Fe Fumarate-FA (PRENATAL VITAMINS) 28-0.8 MG TABS was rejected by Medicaid and needs PA. RX previous PNV reordered. Pt advised.

## 2023-08-13 ENCOUNTER — Telehealth: Payer: Self-pay | Admitting: Obstetrics

## 2023-08-13 NOTE — Telephone Encounter (Signed)
 Reached out to Dr. Verdell Carmine office for modification of venofer order. Dr. Verdell Carmine RN Aundra Millet confirmed that he is ok with switching the pt to Venofer 200 mg IV x 5 doses versus Venofer 500 mg IV x 2 due to scheduling constraints.  Order placed for Venofer 200 mg IV x 5 doses.  Demetrius Charity, PharmD

## 2023-08-16 ENCOUNTER — Ambulatory Visit: Admitting: Nurse Practitioner

## 2023-08-16 VITALS — BP 117/75 | HR 85 | Wt 167.4 lb

## 2023-08-16 DIAGNOSIS — Z3480 Encounter for supervision of other normal pregnancy, unspecified trimester: Secondary | ICD-10-CM

## 2023-08-16 DIAGNOSIS — O99013 Anemia complicating pregnancy, third trimester: Secondary | ICD-10-CM | POA: Diagnosis not present

## 2023-08-16 DIAGNOSIS — Z3A38 38 weeks gestation of pregnancy: Secondary | ICD-10-CM

## 2023-08-16 DIAGNOSIS — D509 Iron deficiency anemia, unspecified: Secondary | ICD-10-CM | POA: Diagnosis not present

## 2023-08-16 NOTE — Progress Notes (Signed)
   PRENATAL VISIT NOTE  Subjective:  Susan Hammond is a 23 y.o. G1P0000 at [redacted]w[redacted]d being seen today for ongoing prenatal care.  She is currently monitored for the following issues for this low-risk pregnancy and has High risk sexual behavior; Mass of lower inner quadrant of right breast; Dental infection; Genital HSV; Supervision of other normal pregnancy, antepartum; Alpha thalassemia silent carrier; Constipation; Galactorrhea during pregnancy; and Anemia complicating pregnancy on their problem list.  Patient reports no complaints.  Contractions: Irritability. Vag. Bleeding: None.  Movement: Present. Denies leaking of fluid.   The following portions of the patient's history were reviewed and updated as appropriate: allergies, current medications, past family history, past medical history, past social history, past surgical history and problem list.   Objective:   Vitals:   08/16/23 1030  BP: 117/75  Pulse: 85  Weight: 167 lb 6.4 oz (75.9 kg)    Fetal Status: Fetal Heart Rate (bpm): 139 Fundal Height: 38 cm Movement: Present     General:  Alert, oriented and cooperative. Patient is in no acute distress.  Skin: Skin is warm and dry. No rash noted.   Cardiovascular: Normal heart rate noted  Respiratory: Normal respiratory effort, no problems with respiration noted  Abdomen: Soft, gravid, appropriate for gestational age.  Pain/Pressure: Absent     Pelvic: Cervical exam deferred        Extremities: Normal range of motion.  Edema: None  Mental Status: Normal mood and affect. Normal behavior. Normal judgment and thought content.   Assessment and Plan:  Pregnancy: G1P0000 at [redacted]w[redacted]d   1. Iron deficiency anemia, unspecified iron deficiency anemia type - taking Accrufer with compliance - S/P iron infusions x 5 ( last 07/02/23) - Asymptomatic for s/s of anemia  - CBC  2. Supervision of other normal pregnancy, antepartum -Doing well -FHR, FH appropriate  3. [redacted] weeks gestation of  pregnancy (Primary) - Labor s/s discussed with patient   Term labor symptoms and general obstetric precautions including but not limited to vaginal bleeding, contractions, leaking of fluid and fetal movement were reviewed in detail with the patient. Please refer to After Visit Summary for other counseling recommendations.    Future Appointments  Date Time Provider Department Center  09/05/2023  2:00 PM [facility] NURSE [facility]    Colman Cater, NP

## 2023-08-16 NOTE — Progress Notes (Signed)
 Pt presents for ROB visit. No concerns

## 2023-08-17 LAB — CBC
Hematocrit: 33.3 % — ABNORMAL LOW (ref 34.0–46.6)
Hemoglobin: 9.9 g/dL — ABNORMAL LOW (ref 11.1–15.9)
MCH: 22 pg — ABNORMAL LOW (ref 26.6–33.0)
MCHC: 29.7 g/dL — ABNORMAL LOW (ref 31.5–35.7)
MCV: 74 fL — ABNORMAL LOW (ref 79–97)
Platelets: 184 10*3/uL (ref 150–450)
RBC: 4.49 x10E6/uL (ref 3.77–5.28)
RDW: 25.2 % — ABNORMAL HIGH (ref 11.7–15.4)
WBC: 12.1 10*3/uL — ABNORMAL HIGH (ref 3.4–10.8)

## 2023-08-19 ENCOUNTER — Other Ambulatory Visit: Payer: Self-pay

## 2023-08-19 ENCOUNTER — Inpatient Hospital Stay (HOSPITAL_COMMUNITY): Admitting: Anesthesiology

## 2023-08-19 ENCOUNTER — Encounter (HOSPITAL_COMMUNITY): Payer: Self-pay | Admitting: Obstetrics and Gynecology

## 2023-08-19 ENCOUNTER — Inpatient Hospital Stay (HOSPITAL_COMMUNITY)
Admission: AD | Admit: 2023-08-19 | Discharge: 2023-08-21 | DRG: 805 | Disposition: A | Discharge status: Home / Self Care | Attending: Family Medicine | Admitting: Family Medicine

## 2023-08-19 DIAGNOSIS — Z833 Family history of diabetes mellitus: Secondary | ICD-10-CM | POA: Diagnosis not present

## 2023-08-19 DIAGNOSIS — B9689 Other specified bacterial agents as the cause of diseases classified elsewhere: Secondary | ICD-10-CM | POA: Diagnosis not present

## 2023-08-19 DIAGNOSIS — O9832 Other infections with a predominantly sexual mode of transmission complicating childbirth: Secondary | ICD-10-CM | POA: Diagnosis not present

## 2023-08-19 DIAGNOSIS — Z3A38 38 weeks gestation of pregnancy: Secondary | ICD-10-CM | POA: Diagnosis not present

## 2023-08-19 DIAGNOSIS — O9902 Anemia complicating childbirth: Principal | ICD-10-CM | POA: Diagnosis present

## 2023-08-19 DIAGNOSIS — A6000 Herpesviral infection of urogenital system, unspecified: Secondary | ICD-10-CM | POA: Diagnosis present

## 2023-08-19 DIAGNOSIS — Z148 Genetic carrier of other disease: Secondary | ICD-10-CM

## 2023-08-19 DIAGNOSIS — O26893 Other specified pregnancy related conditions, third trimester: Secondary | ICD-10-CM | POA: Diagnosis not present

## 2023-08-19 LAB — CBC
HCT: 36.5 % (ref 36.0–46.0)
Hemoglobin: 10.7 g/dL — ABNORMAL LOW (ref 12.0–15.0)
MCH: 22.3 pg — ABNORMAL LOW (ref 26.0–34.0)
MCHC: 29.3 g/dL — ABNORMAL LOW (ref 30.0–36.0)
MCV: 76 fL — ABNORMAL LOW (ref 80.0–100.0)
Platelets: 200 10*3/uL (ref 150–400)
RBC: 4.8 MIL/uL (ref 3.87–5.11)
RDW: 26.2 % — ABNORMAL HIGH (ref 11.5–15.5)
WBC: 13.1 10*3/uL — ABNORMAL HIGH (ref 4.0–10.5)
nRBC: 0 % (ref 0.0–0.2)

## 2023-08-19 LAB — HIV ANTIBODY (ROUTINE TESTING W REFLEX): HIV Screen 4th Generation wRfx: NONREACTIVE

## 2023-08-19 LAB — TYPE AND SCREEN
ABO/RH(D): B POS
Antibody Screen: NEGATIVE

## 2023-08-19 MED ORDER — COCONUT OIL OIL: 1.0000 | TOPICAL_OIL | Status: DC | PRN

## 2023-08-19 MED ORDER — PRENATAL MULTIVITAMIN CH
1.0000 | ORAL_TABLET | Freq: Every day | ORAL | Status: DC
  Filled 2023-08-19 (×2): qty 1

## 2023-08-19 MED ORDER — ONDANSETRON HCL 4 MG PO TABS: 4.0000 mg | ORAL_TABLET | ORAL | Status: DC | PRN

## 2023-08-19 MED ORDER — VALACYCLOVIR HCL 500 MG PO TABS
500.0000 mg | ORAL_TABLET | Freq: Two times a day (BID) | ORAL | Status: DC
  Administered 2023-08-19: 500 mg via ORAL
  Filled 2023-08-19: qty 1

## 2023-08-19 MED ORDER — WITCH HAZEL-GLYCERIN EX PADS: 1.0000 | MEDICATED_PAD | CUTANEOUS | Status: DC | PRN

## 2023-08-19 MED ORDER — LIDOCAINE HCL (PF) 1 % IJ SOLN
INTRAMUSCULAR | Status: DC | PRN
  Administered 2023-08-19 (×2): 4 mL via EPIDURAL

## 2023-08-19 MED ORDER — SIMETHICONE 80 MG PO CHEW: 80.0000 mg | CHEWABLE_TABLET | ORAL | Status: DC | PRN

## 2023-08-19 MED ORDER — ACETAMINOPHEN 325 MG PO TABS: 650.0000 mg | ORAL_TABLET | ORAL | Status: DC | PRN

## 2023-08-19 MED ORDER — FLEET ENEMA RE ENEM: 1.0000 | ENEMA | RECTAL | Status: DC | PRN

## 2023-08-19 MED ORDER — PHENYLEPHRINE 80 MCG/ML (10ML) SYRINGE FOR IV PUSH (FOR BLOOD PRESSURE SUPPORT)
PREFILLED_SYRINGE | INTRAVENOUS | Status: AC
  Filled 2023-08-19: qty 10

## 2023-08-19 MED ORDER — DIBUCAINE (PERIANAL) 1 % EX OINT: 1.0000 | TOPICAL_OINTMENT | CUTANEOUS | Status: DC | PRN

## 2023-08-19 MED ORDER — OXYTOCIN BOLUS FROM INFUSION
333.0000 mL | Freq: Once | INTRAVENOUS | Status: AC
  Administered 2023-08-19: 333 mL via INTRAVENOUS

## 2023-08-19 MED ORDER — OXYTOCIN-SODIUM CHLORIDE 30-0.9 UT/500ML-% IV SOLN: 2.5000 [IU]/h | INTRAVENOUS | Status: DC

## 2023-08-19 MED ORDER — FENTANYL-BUPIVACAINE-NACL 0.5-0.125-0.9 MG/250ML-% EP SOLN
EPIDURAL | Status: DC | PRN
Start: 2023-08-19 — End: 2023-08-19
  Administered 2023-08-19: 12 mL/h via EPIDURAL

## 2023-08-19 MED ORDER — SOD CITRATE-CITRIC ACID 500-334 MG/5ML PO SOLN: 30.0000 mL | ORAL | Status: DC | PRN

## 2023-08-19 MED ORDER — LIDOCAINE HCL (PF) 1 % IJ SOLN: 30.0000 mL | INTRAMUSCULAR | Status: DC | PRN

## 2023-08-19 MED ORDER — TETANUS-DIPHTH-ACELL PERTUSSIS 5-2.5-18.5 LF-MCG/0.5 IM SUSY: 0.5000 mL | PREFILLED_SYRINGE | Freq: Once | INTRAMUSCULAR | Status: DC

## 2023-08-19 MED ORDER — LACTATED RINGERS IV SOLN: 500.0000 mL | INTRAVENOUS | Status: DC | PRN

## 2023-08-19 MED ORDER — FERROUS SULFATE 325 (65 FE) MG PO TABS
325.0000 mg | ORAL_TABLET | ORAL | Status: DC
  Administered 2023-08-19: 325 mg via ORAL
  Filled 2023-08-19: qty 1

## 2023-08-19 MED ORDER — LACTATED RINGERS IV SOLN: INTRAVENOUS | Status: DC

## 2023-08-19 MED ORDER — ZOLPIDEM TARTRATE 5 MG PO TABS: 5.0000 mg | ORAL_TABLET | Freq: Every evening | ORAL | Status: DC | PRN

## 2023-08-19 MED ORDER — BENZOCAINE-MENTHOL 20-0.5 % EX AERO
1.0000 | INHALATION_SPRAY | CUTANEOUS | Status: DC | PRN
  Administered 2023-08-21: 1 via TOPICAL
  Filled 2023-08-19: qty 56

## 2023-08-19 MED ORDER — PANTOPRAZOLE SODIUM 40 MG PO TBEC
40.0000 mg | DELAYED_RELEASE_TABLET | Freq: Two times a day (BID) | ORAL | Status: DC
  Filled 2023-08-19: qty 1

## 2023-08-19 MED ORDER — ONDANSETRON HCL 4 MG/2ML IJ SOLN
4.0000 mg | Freq: Four times a day (QID) | INTRAMUSCULAR | Status: DC | PRN
  Filled 2023-08-19: qty 2

## 2023-08-19 MED ORDER — TERBUTALINE SULFATE 1 MG/ML IJ SOLN: 0.2500 mg | Freq: Once | INTRAMUSCULAR | Status: DC | PRN

## 2023-08-19 MED ORDER — ONDANSETRON HCL 4 MG/2ML IJ SOLN: 4.0000 mg | INTRAMUSCULAR | Status: DC | PRN

## 2023-08-19 MED ORDER — OXYCODONE-ACETAMINOPHEN 5-325 MG PO TABS: 1.0000 | ORAL_TABLET | ORAL | Status: DC | PRN

## 2023-08-19 MED ORDER — IBUPROFEN 600 MG PO TABS
600.0000 mg | ORAL_TABLET | Freq: Four times a day (QID) | ORAL | Status: DC
  Filled 2023-08-19 (×4): qty 1

## 2023-08-19 MED ORDER — FENTANYL-BUPIVACAINE-NACL 0.5-0.125-0.9 MG/250ML-% EP SOLN
EPIDURAL | Status: AC
  Filled 2023-08-19: qty 250

## 2023-08-19 MED ORDER — DIPHENHYDRAMINE HCL 25 MG PO CAPS: 25.0000 mg | ORAL_CAPSULE | Freq: Four times a day (QID) | ORAL | Status: DC | PRN

## 2023-08-19 MED ORDER — FERRIC MALTOL 30 MG PO CAPS: 1.0000 | ORAL_CAPSULE | Freq: Two times a day (BID) | ORAL | Status: DC

## 2023-08-19 MED ORDER — OXYTOCIN-SODIUM CHLORIDE 30-0.9 UT/500ML-% IV SOLN
1.0000 m[IU]/min | INTRAVENOUS | Status: DC
  Administered 2023-08-19: 2 m[IU]/min via INTRAVENOUS
  Filled 2023-08-19: qty 500

## 2023-08-19 MED ORDER — SENNOSIDES-DOCUSATE SODIUM 8.6-50 MG PO TABS
2.0000 | ORAL_TABLET | Freq: Every day | ORAL | Status: DC
  Administered 2023-08-20 – 2023-08-21 (×2): 2 via ORAL
  Filled 2023-08-19 (×2): qty 2

## 2023-08-19 MED ORDER — OXYCODONE-ACETAMINOPHEN 5-325 MG PO TABS: 2.0000 | ORAL_TABLET | ORAL | Status: DC | PRN

## 2023-08-19 NOTE — Progress Notes (Addendum)
 Labor Progress Note Susan Hammond is a 23 y.o. G1P0000 at [redacted]w[redacted]d presented for SOL. S: pt comfortable in bed. Epidural in place   O:  BP 121/66   Pulse 96   Temp 98.8 F (37.1 C) (Axillary)   Resp 18   LMP 11/22/2022   SpO2 100%  EFM: 135 bpm/moderate variability/accelerations/no decels  CVE: Dilation: 5.5 Effacement (%): 70 Station: -2 Presentation: Vertex Exam by:: Courtney Fenlon   A&P: 22 y.o. G1P0000 [redacted]w[redacted]d for SOL.  #Labor: Progressing well. AROM clear. Start pitocin 2x2 45 min post AROM if continued Cat 1 strip.  #Pain: Well controlled. Epidural in place  #FWB: cat 1 #GBS negative #HSV: negative spec. Was not on suppressive therapy. Started on valtrex.  #Anemia in pregnancy: starting Hgb 10.7.   Denton Ar, DO, MPH 10:37 AM  GME ATTESTATION:  Evaluation and management procedures were performed by the [facility] Medicine Resident under my supervision. I was immediately available for direct supervision, assistance and direction throughout this encounter.  I also confirm that I have verified the information documented in the resident's note, and that I have also personally reperformed the pertinent components of the physical exam and all of the medical decision making activities.  I have also made any necessary editorial changes.  Wyn Forster, MD OB Fellow, [facility], Center for [facility] 08/19/2023 12:00 PM

## 2023-08-19 NOTE — Anesthesia Preprocedure Evaluation (Signed)
Anesthesia Evaluation  Patient identified by MRN, date of birth, ID band Patient awake    Reviewed: Allergy & Precautions, Patient's Chart, lab work & pertinent test results  History of Anesthesia Complications Negative for: history of anesthetic complications  Airway Mallampati: II  TM Distance: >3 FB Neck ROM: Full    Dental no notable dental hx.    Pulmonary neg pulmonary ROS   Pulmonary exam normal        Cardiovascular negative cardio ROS Normal cardiovascular exam     Neuro/Psych negative neurological ROS  negative psych ROS   GI/Hepatic negative GI ROS, Neg liver ROS,,,  Endo/Other  negative endocrine ROS    Renal/GU negative Renal ROS  negative genitourinary   Musculoskeletal negative musculoskeletal ROS (+)    Abdominal   Peds  Hematology  (+) Blood dyscrasia, anemia   Anesthesia Other Findings Day of surgery medications reviewed with patient.  Reproductive/Obstetrics (+) Pregnancy                              Anesthesia Physical Anesthesia Plan  ASA: 2  Anesthesia Plan: Epidural   Post-op Pain Management:    Induction:   PONV Risk Score and Plan: Treatment may vary due to age or medical condition  Airway Management Planned: Natural Airway  Additional Equipment: Fetal Monitoring  Intra-op Plan:   Post-operative Plan:   Informed Consent: I have reviewed the patients History and Physical, chart, labs and discussed the procedure including the risks, benefits and alternatives for the proposed anesthesia with the patient or authorized representative who has indicated his/her understanding and acceptance.       Plan Discussed with:   Anesthesia Plan Comments:          Anesthesia Quick Evaluation

## 2023-08-19 NOTE — Anesthesia Procedure Notes (Signed)
 Epidural Patient location during procedure: OB Start time: 08/19/2023 3:36 AM End time: 08/19/2023 3:39 AM  Staffing Anesthesiologist: Kaylyn Layer, MD Performed: anesthesiologist   Preanesthetic Checklist Completed: patient identified, IV checked, risks and benefits discussed, monitors and equipment checked, pre-op evaluation and timeout performed  Epidural Patient position: sitting Prep: DuraPrep and site prepped and draped Patient monitoring: continuous pulse ox, blood pressure and heart rate Approach: midline Location: L3-L4 Injection technique: LOR air  Needle:  Needle type: Tuohy  Needle gauge: 17 G Needle length: 9 cm Needle insertion depth: 6 cm Catheter type: closed end flexible Catheter size: 19 Gauge Catheter at skin depth: 11 cm Test dose: negative and Other (1% lidocaine)  Assessment Events: blood not aspirated, no cerebrospinal fluid, injection not painful, no injection resistance, no paresthesia and negative IV test  Additional Notes Patient identified. Risks, benefits, and alternatives discussed with patient including but not limited to bleeding, infection, nerve damage, paralysis, failed block, incomplete pain control, headache, blood pressure changes, nausea, vomiting, reactions to medication, itching, and postpartum back pain. Confirmed with bedside nurse the patient's most recent platelet count. Confirmed with patient that they are not currently taking any anticoagulation, have any bleeding history, or any family history of bleeding disorders. Patient expressed understanding and wished to proceed. All questions were answered. Sterile technique was used throughout the entire procedure. Please see nursing notes for vital signs.   Crisp LOR on first pass. Test dose was given through epidural catheter and negative prior to continuing to dose epidural or start infusion. Warning signs of high block given to the patient including shortness of breath,  tingling/numbness in hands, complete motor block, or any concerning symptoms with instructions to call for help. Patient was given instructions on fall risk and not to get out of bed. All questions and concerns addressed with instructions to call with any issues or inadequate analgesia.  Reason for block:procedure for pain

## 2023-08-19 NOTE — Discharge Summary (Signed)
 Postpartum Discharge Summary  Date of Service updated***     Patient Name: Susan Hammond DOB: 2001/03/10 MRN: [id number]  Date of admission: 08/19/2023 Delivery date:08/19/2023 Delivering provider: Wyn Forster Date of discharge: 08/19/2023  Admitting diagnosis: Normal labor [O80, Z37.9] Intrauterine pregnancy: [redacted]w[redacted]d     Secondary diagnosis:  Principal Problem:   Normal labor  Additional problems:  #Alpha thalassemia silent carrier  #Galactorrhea during pregnancy  #Anemia in pregnancy  #Genital HSV     Discharge diagnosis: Term Pregnancy Delivered                                              Post partum procedures: none Augmentation: AROM and Pitocin Complications: None  Hospital course: Onset of Labor With Vaginal Delivery      23 y.o. yo G1P1001 at [redacted]w[redacted]d was admitted in Active Labor on 08/19/2023. Labor course was complicated by none  Membrane Rupture Time/Date: 9:44 AM,08/19/2023  Delivery Method:Vaginal, Spontaneous Operative Delivery:N/A Episiotomy: None Lacerations:  2nd degree Patient had a postpartum course complicated by ***.  She is ambulating, tolerating a regular diet, passing flatus, and urinating well. Patient is discharged home in stable condition on 08/19/23.  Newborn Data: Birth date:08/19/2023 Birth time:3:23 PM Gender:Female Living status:Living Apgars:8 ,9  Weight:3310 g  Magnesium Sulfate received: No BMZ received: No Rhophylac:N/A MMR:Yes T-DaP:{Tdap:23962} Flu: No RSV Vaccine received: No Transfusion:No  Immunizations received: Immunization History  Administered Date(s) Administered   Hepb-cpg 01/08/2023   Influenza,inj,Quad PF,6+ Mos 08/08/2016   PPD Test 11/06/2022, 11/13/2022   Tdap 11/15/2022    Physical exam  Vitals:   08/19/23 1615 08/19/23 1630 08/19/23 1645 08/19/23 1718  BP: 121/71 124/68 119/75 122/66  Pulse: 85 79 85 86  Resp:   18 17  Temp:   98 F (36.7 C) 98.1 F (36.7 C)  TempSrc:   Oral Oral  SpO2:     100%   General: {Exam; general:21111117} Lochia: {Desc; appropriate/inappropriate:30686::"appropriate"} Uterine Fundus: {Desc; firm/soft:30687} Incision: {Exam; incision:21111123} DVT Evaluation: {Exam; dvt:2111122} Labs: Lab Results  Component Value Date   WBC 13.1 (H) 08/19/2023   HGB 10.7 (L) 08/19/2023   HCT 36.5 08/19/2023   MCV 76.0 (L) 08/19/2023   PLT 200 08/19/2023      Latest Ref Rng & Units 11/14/2017   10:11 PM  CMP  Glucose 70 - 99 mg/dL 86   BUN 6 - 20 mg/dL 6   Creatinine 4.09 - 8.11 mg/dL 9.14   Sodium 782 - 956 mmol/L 135   Potassium 3.5 - 5.1 mmol/L 3.5   Chloride 98 - 111 mmol/L 103   CO2 22 - 32 mmol/L 17   Calcium 8.9 - 10.3 mg/dL 8.8   Total Protein 6.5 - 8.1 g/dL 7.5   Total Bilirubin 0.3 - 1.2 mg/dL 0.6   Alkaline Phos 38 - 126 U/L 65   AST 15 - 41 U/L 62   ALT 0 - 44 U/L 35    Edinburgh Score:     No data to display         No data recorded  After visit meds:  Allergies as of 08/19/2023   No Known Allergies   Med Rec must be completed prior to using this [facility]***        Discharge home in stable condition Infant Feeding: Breast Infant Disposition:home with mother Discharge instruction: per After Visit  Summary and Postpartum booklet. Activity: Advance as tolerated. Pelvic rest for 6 weeks.  Diet: routine diet Future Appointments: Future Appointments  Date Time Provider Department Center  08/23/2023 10:35 AM Clement Sayres E, PA-C [facility] None  09/05/2023  2:00 PM [facility] NURSE [facility]   Follow up Visit:   Please schedule this patient for a In person postpartum visit in 4 weeks with the following provider: Any provider. Additional Postpartum F/U:Postpartum Depression checkup  Low risk pregnancy complicated by:  mass of lower right breast, genital HSV, galactorrhea during pregnancy, anemia in pregnancy Delivery mode:  Vaginal, Spontaneous Anticipated Birth Control:  Unsure btwn depo and POCs.      08/19/2023 Denton Ar, MD

## 2023-08-19 NOTE — MAU Note (Signed)
.  Susan Hammond is a 23 y.o. at [redacted]w[redacted]d here in MAU reporting: contractions every 3-6 minutes that woke her from sleep at 2300. Denies SROM, vaginal bleeding or bloody show. Endorses + fetal movement. GBS - Pregnancy complicated by anemia-has received iron infusions HSV diagnosis new-possible outbreak 09/12-pt has not been taking valtrex for suppression.  Onset of complaint: 2300 Pain score: 8 There were no vitals filed for this visit.   FHT: taken to room  Lab orders placed from triage: mau labor

## 2023-08-19 NOTE — H&P (Signed)
 OBSTETRIC ADMISSION HISTORY AND PHYSICAL  Susan Hammond is a 23 y.o. female G1P0000 with IUP at [redacted]w[redacted]d by LMP presenting for labor.   Patient reports contractions woke her up around 23:00. Have been coming q2-63min. Denies LOF, VB, abnormal VD, or decreased FM. No headaches, vision changes, CP, SOB, N/V, RUQ pain, or increased edema. She plans on breast feeding. She is undecided but considering OCPs for birth control. She received her prenatal care at  [facility] .  Dating: By LMP --->  Estimated Date of Delivery: 08/29/23  Sono:    @[redacted]w[redacted]d , CWD, normal anatomy, cephalic presentation, anterior placental lie, 313g, 50% EFW   Prenatal History/Complications:  G1 - current  Past Medical History: Anemia Genital HSV Alpha thalassemia carrier BV in pregnancy  Past Surgical History: Past Surgical History:  Procedure Laterality Date   BREAST BIOPSY Right 04/23/2022   [place] RT BREAST BX W LOC DEV 1ST LESION IMG BX SPEC US GUIDE 04/23/2022 [facility] MAMMOGRAPHY    Obstetrical History: OB History     Gravida  1   Para  0   Term  0   Preterm  0   AB  0   Living  0      SAB  0   IAB  0   Ectopic  0   Multiple  0   Live Births  0           Social History Social History   Socioeconomic History   Marital status: Single    Spouse name: Not on file   Number of children: Not on file   Years of education: Not on file   Highest education level: Not on file  Occupational History   Not on file  Tobacco Use   Smoking status: Never    Passive exposure: Never   Smokeless tobacco: Never  Vaping Use   Vaping status: Never Used  Substance and Sexual Activity   Alcohol use: No   Drug use: No   Sexual activity: Yes    Partners: Male    Comment: unpotected   Other Topics Concern   Not on file  Social History Narrative   Not on file   Social Drivers of Health   Financial Resource Strain: Not on file  Food Insecurity: Not on file  Transportation Needs: Not on file   Physical Activity: Not on file  Stress: Not on file  Social Connections: Not on file    Family History: Family History  Problem Relation Age of Onset   Healthy Mother    Healthy Father    Cancer Maternal Aunt    Diabetes Paternal Grandmother     Allergies: No Known Allergies  Medications Prior to Admission  Medication Sig Dispense Refill Last Dose/Taking   Ferric Maltol (ACCRUFER) 30 MG CAPS Take 1 capsule (30 mg total) by mouth 2 (two) times daily at 8 am and 10 pm. 60 capsule 5 08/18/2023   metroNIDAZOLE (FLAGYL) 500 MG tablet Take 1 tablet (500 mg total) by mouth 2 (two) times daily. 14 tablet 0 Past Week   pantoprazole (PROTONIX) 40 MG tablet Take 1 tablet (40 mg total) by mouth 2 (two) times daily before a meal. 60 tablet 5 08/18/2023 Bedtime   loratadine (CLARITIN) 10 MG tablet Take 1 tablet (10 mg total) by mouth daily. 30 tablet 11    Prenat-FeFum-DSS-FA-DHA w/o A (PNV-DHA+DOCUSATE) 27-1.25-300 MG CAPS Take 1 capsule by mouth daily before breakfast. 90 capsule 4 More than a month   Prenatal  Vit-Fe Fumarate-FA (PRENATAL VITAMINS) 28-0.8 MG TABS Take 1 tablet by mouth daily. 30 tablet 5 More than a month  Not taking loratadine or PNV Self-discontinued flagyl after around 4 doses   Review of Systems   All systems reviewed and negative except as stated in HPI  Last menstrual period 11/22/2022. General appearance: alert, cooperative, and no distress Lungs: clear to auscultation bilaterally, no increased work of breathing, able to speak in complete sentences Heart: regular rate and rhythm, no murmurs/rubs/gallops, normal peripheral perfusion, peripheral pulses 2+ Abdomen: soft, non-tender, normoactive bowel sounds, gravid Pelvic: no concerning lesions on SSE, pink papule on clitoral hood is baseline per FOB (has been checking for outbreaks regularly), otherwise normal external appearance of female genitalia Extremities: moves all extremities spontaneously, no deformity, no  tenderness, mild nonpitting edema in BLE, no signs of DVT Presentation: cephalic Fetal monitoringBaseline: 140 bpm, Variability: Good {> 6 bpm), Accelerations: Reactive, and Decelerations: Absent Uterine activityFrequency: Every 4 minutes Dilation: (P) 5 Effacement (%): (P) 70 Station: (P) -2 Exam by:: (P) Ariel B, RN   Prenatal labs: ABO, Rh: B/Positive/-- (08/23 1701) Antibody: Negative (08/23 1701) Rubella: 1.84 (08/23 1701) RPR: Non Reactive (01/20 0920)  HBsAg: Negative (11/12 1438)  HIV: Non Reactive (01/20 0920)  GBS: Negative/-- (03/06 1714)    Lab Results  Component Value Date   GBS Negative 07/25/2023   GTT nrl Genetic screening  LR female on NIPS, Alpha thal carrier on Horizon, AFP negative  Anatomy US normal   Immunization History  Administered Date(s) Administered   Hepb-cpg 01/08/2023   Influenza,inj,Quad PF,6+ Mos 08/08/2016   PPD Test 11/06/2022, 11/13/2022   Tdap 11/15/2022    Prenatal Transfer Tool  Maternal Diabetes: No Genetic Screening: Normal Maternal Ultrasounds/Referrals: Normal Fetal Ultrasounds or other Referrals:  None Maternal Substance Abuse:  No Significant Maternal Medications:  None Significant Maternal Lab Results: Group B Strep negative Number of Prenatal Visits:greater than 3 verified prenatal visits Maternal Vaccinations: none (Hep B 01/08/23, Tdap 11/15/22) Other Comments:  None   No results found for this or any previous visit (from the past 24 hours).  Patient Active Problem List   Diagnosis Date Noted   Anemia complicating pregnancy 06/14/2023   Constipation 03/13/2023   Galactorrhea during pregnancy 03/13/2023   Alpha thalassemia silent carrier 02/20/2023   Supervision of other normal pregnancy, antepartum 02/08/2023   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   Dental infection 11/08/2022   Mass of lower inner quadrant of right breast 03/13/2022   High risk sexual behavior 02/22/2020    Assessment/Plan:  Susan Hammond is a 23  y.o. G1P0000 at [redacted]w[redacted]d here for labor.  #Labor: expectant management vs AROM then pit after epidural.  #Pain: Epidural #FWB: Cat 1 #GBS status:  negative #Feeding: Breastmilk  #Reproductive Life planning:  Considering OCPs #Circ:  yes  #HSV: dx 01/31/23, superior clitoral hood, two lesions, no prodromal symptoms, did not start valtrex suppression at 36wks, negative spec on admit  #Anemia: admission Hgb 10.7  #BV: pt sent px 3/10 for BV infection, finished only 4 doses before cessation - f/u wet prep  #Alpha thal silent carrier  Thereasa Solo, MD  08/19/2023, 2:53 AM  Evaluation and management procedures were performed by the [facility] Medicine Resident under my supervision. I was immediately available for direct supervision, assistance and direction throughout this encounter.  I also confirm that I have verified the information documented in the resident's note, and that I have also personally reperformed the pertinent components of the physical exam  and all of the medical decision making activities.  I have also made any necessary editorial changes.   Mittie Bodo, MD Family Medicine - Obstetrics Fellow

## 2023-08-20 ENCOUNTER — Other Ambulatory Visit (HOSPITAL_COMMUNITY): Payer: Self-pay

## 2023-08-20 LAB — CBC
HCT: 31.9 % — ABNORMAL LOW (ref 36.0–46.0)
Hemoglobin: 9.4 g/dL — ABNORMAL LOW (ref 12.0–15.0)
MCH: 22.2 pg — ABNORMAL LOW (ref 26.0–34.0)
MCHC: 29.5 g/dL — ABNORMAL LOW (ref 30.0–36.0)
MCV: 75.2 fL — ABNORMAL LOW (ref 80.0–100.0)
Platelets: 174 10*3/uL (ref 150–400)
RBC: 4.24 MIL/uL (ref 3.87–5.11)
RDW: 25.6 % — ABNORMAL HIGH (ref 11.5–15.5)
WBC: 20.1 10*3/uL — ABNORMAL HIGH (ref 4.0–10.5)
nRBC: 0 % (ref 0.0–0.2)

## 2023-08-20 LAB — RPR: RPR Ser Ql: NONREACTIVE

## 2023-08-20 NOTE — Lactation Note (Signed)
 This note was copied from a baby's chart. Lactation Consultation Note  Patient Name: Susan Hammond [id number] Date: 08/20/2023 Age:25 hours Reason for consult: Follow-up assessment;Primapara;Early term 15-38.6wks RN spoke w/LC about baby being jittery and not eating. Wouldn't latch, RN gave some formula and spit up all of it. LC went to check on baby see if I could get baby to take some formula. LC got baby to take some from bottle, he was drinking good then he gagged and spit milk up. Baby is jittery. LC attempted to spoon fed some formula. Baby took well then spit it up. Placed baby STS on mom and reported to RN.  Maternal Data    Feeding Mother's Current Feeding Choice: Formula Nipple Type: Nfant Standard Flow (white)  LATCH Score                    Lactation Tools Discussed/Used    Interventions Interventions: Skin to skin;Pace feeding;Education  Discharge    Consult Status Consult Status: Follow-up Date: 08/20/23 Follow-up type: In-patient    Charyl Dancer 08/20/2023, 4:57 AM

## 2023-08-20 NOTE — Progress Notes (Signed)
 POSTPARTUM PROGRESS NOTE  Post Partum Day 1  Subjective:  Susan Hammond is a 23 y.o. G1P1001 s/p SVD at [redacted]w[redacted]d.  She reports she is doing well. No acute events overnight. She denies any problems with ambulating, voiding or po intake. Denies nausea or vomiting.  Pain is well controlled.  Lochia is appropriate.  Objective: Blood pressure 115/72, pulse 80, temperature 98.9 F (37.2 C), resp. rate 18, last menstrual period 11/22/2022, SpO2 100%, unknown if currently breastfeeding.  Physical Exam:  General: alert, cooperative and no distress Chest: no respiratory distress Heart:regular rate, distal pulses intact Uterine Fundus: firm, appropriately tender DVT Evaluation: No calf swelling or tenderness Extremities: trace edema Skin: warm, dry  Recent Labs    08/19/23 0251 08/20/23 0432  HGB 10.7* 9.4*  HCT 36.5 31.9*    Assessment/Plan: Susan Hammond is a 23 y.o. G1P1001 s/p SVD at [redacted]w[redacted]d   PPD#1 - Doing well  Routine postpartum care Contraception: unsure Feeding: breast Dispo: Plan for discharge 4/2.   LOS: 1 day   Hessie Dibble, MD OB Fellow  08/20/2023, 8:54 AM

## 2023-08-20 NOTE — Anesthesia Postprocedure Evaluation (Signed)
 Anesthesia Post Note  Patient: Susan Hammond  Procedure(s) Performed: AN AD HOC LABOR EPIDURAL     Patient location during evaluation: Mother Baby Anesthesia Type: Epidural Level of consciousness: awake Pain management: satisfactory to patient Vital Signs Assessment: post-procedure vital signs reviewed and stable Respiratory status: spontaneous breathing Cardiovascular status: stable Anesthetic complications: no  No notable events documented.  Last Vitals:  Vitals:   08/20/23 0304 08/20/23 0643  BP: 119/70 115/72  Pulse: 81 80  Resp: 18 18  Temp: 37.1 C 37.2 C  SpO2: 100% 100%    Last Pain:  Vitals:   08/20/23 0735  TempSrc:   PainSc: 5    Pain Goal:                   Cephus Shelling

## 2023-08-20 NOTE — Progress Notes (Addendum)
 CSW has contacted RN case manager Susan Hammond about MOB's needs for medication support.   CSW identifies no further need for intervention and no barriers to discharge at this time.  Susan Hammond, Susan Hammond Clinical Social Worker [phone]

## 2023-08-20 NOTE — Lactation Note (Signed)
 This note was copied from a baby's chart. Lactation Consultation Note  Patient Name: Susan Hammond [id number] Date: 08/20/2023 Age:25 hours Reason for consult: Initial assessment;Primapara;Early term 56-38.6wks Mom has attempted to latch but baby isn't interested in BF. Baby BF well after delivery but not since then. LC un-swaddled baby trying to stimulate baby. Baby has no interest in BF. Wouldn't open mouth. Newborn feeding habits, behavior, STS, I&O, body alignment, supply and demand reviewed. Mom encouraged to feed baby 8-12 times/24 hours and with feeding cues.  Worked w/mom positioning and latching. Encouraged mom to try again in 3 hrs or sooner if cueing.  Call for assistance as needed.  Maternal Data Has patient been taught Hand Expression?: Yes Does the patient have breastfeeding experience prior to this delivery?: No  Feeding Nipple Type: Slow - flow  LATCH Score Latch: Too sleepy or reluctant, no latch achieved, no sucking elicited.  Audible Swallowing: None  Type of Nipple: Everted at rest and after stimulation  Comfort (Breast/Nipple): Soft / non-tender  Hold (Positioning): Assistance needed to correctly position infant at breast and maintain latch.  LATCH Score: 5   Lactation Tools Discussed/Used    Interventions Interventions: Breast feeding basics reviewed;Assisted with latch;Skin to skin;Breast massage;Hand express;Breast compression;Adjust position;Support pillows;Position options;Education;LC Services brochure  Discharge    Consult Status Consult Status: Follow-up Date: 08/20/23 Follow-up type: In-patient    Susan Hammond 08/20/2023, 12:34 AM

## 2023-08-20 NOTE — TOC Initial Note (Signed)
 Transition of Care [facility]) - Initial/Assessment Note    Patient Details  Name: Susan Hammond MRN: [id number] Date of Birth: [birth year]  [facility]) CM/SW Contact:    Geoffery Lyons, RN Phone Number:[phone] 08/20/2023, 3:41 PM  Clinical Narrative:                    Patient is a post vaginal delivery. Patient had consult for medication needs. CM met with patient and baby and FOB in room to discuss any needs or barriers to discharge. Patient denied any medication barriers at this time. CM explained that she will be able to get any medications at discharge sent to our [facility] pharmacy and be filled and sent to room prior to discharge. Patient verbalized understanding. Patient denied and needs and no barriers with transportation.    Prior Living Arrangements/Services  apartment Activities of Daily Living   ADL Screening (condition at time of admission) Independently performs ADLs?: Yes (appropriate for developmental age) Is the patient deaf or have difficulty hearing?: No Does the patient have difficulty seeing, even when wearing glasses/contacts?: No Does the patient have difficulty concentrating, remembering, or making decisions?: No    Admission diagnosis:  Normal labor [O80, Z37.9] Patient Active Problem List   Diagnosis Date Noted   Normal labor 08/19/2023   Anemia complicating pregnancy 06/14/2023   Constipation 03/13/2023   Galactorrhea during pregnancy 03/13/2023   Alpha thalassemia silent carrier 02/20/2023   Supervision of other normal pregnancy, antepartum 02/08/2023   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   Dental infection 11/08/2022   Mass of lower inner quadrant of right breast 03/13/2022   High risk sexual behavior 02/22/2020   PCP:  Lockie Mola, MD Pharmacy:   [facility] - [place] - [address] [place] [address] Phone: [phone] Fax: [phone]  [facility] [address] [place] [address] [place] [address] Phone: [phone] Fax: [phone]  [facility] - [place], [place] - [address] [place] [address] Phone: [phone] Fax: [phone]  [facility] - [place], [place] - [address] [place] [address] Phone: [phone] Fax: [phone]  [facility] [place], [place] - [phone] [place] [address] Phone: [phone] Fax: [phone]  [facility] [address] [place] [address] Phone: [phone] Fax: [phone]     Social Drivers of Health (SDOH) Social History: SDOH Screenings   Food Insecurity: No Food Insecurity (08/19/2023)  Housing: Unknown (08/19/2023)  Transportation Needs: No Transportation Needs (08/19/2023)  Utilities: Not At Risk (08/19/2023)  Depression (PHQ2-9): Low Risk  (07/02/2023)  Tobacco Use: Low Risk  (08/19/2023)   SDOH Interventions:     Readmission Risk Interventions     No data to display

## 2023-08-20 NOTE — Lactation Note (Signed)
 This note was copied from a baby's chart. Lactation Consultation Note  Patient Name: Susan Hammond [id number] Date: 08/20/2023 Age:25 hours Reason for consult: Follow-up assessment;Primapara;1st time breastfeeding;Early term 37-38.6wks (HSV +)  P1- MOB reports that she has been pumping and offering EBM/formula only since infant was born. MOB reports that infant has not been willing nor ready to latch yet, so she has been focusing on providing milk in other ways. MOB requested to have an outpatient appointment set up to work on latching skills. [facility] sent outpatient referral request. MOB had just formula fed infant using the lavender nipple as encouraged by the SLP. LC will request for the day shift LC to see MOB as one of the first patients to try to latch infant before discharge.  It was time for MOB to pump again, so LC offered to assist and to flange size MOB. MOB consented. MOB has been using the 24 mm flanges. LC sized MOB at a 21 mm flange, and provided her with coconut oil for less friction. LC watched MOB pump for 7 minutes and noted about 1 mL bilaterally collected in the collectors. LC praised MOB. MOB has no further questions or concerns at this time. LC reviewed CDC milk storage guidelines, LC services handout and engorgement/breast care. LC encouraged MOB to call for further assistance as needed.  Maternal Data Has patient been taught Hand Expression?: Yes Does the patient have breastfeeding experience prior to this delivery?: No  Feeding Mother's Current Feeding Choice: Breast Milk and Formula  Lactation Tools Discussed/Used Tools: Pump;Flanges;Coconut oil Flange Size: 21 Breast pump type: Double-Electric Breast Pump;Manual Pump Education: Setup, frequency, and cleaning;Milk Storage Reason for Pumping: MOB request Pumping frequency: 15-20 min every 3 hrs  Interventions Interventions: Breast feeding basics reviewed;Expressed milk;Coconut oil;Hand pump;DEBP;Education;LC  Services brochure  Discharge Discharge Education: Engorgement and breast care;Warning signs for feeding baby;Outpatient recommendation;Outpatient Epic message sent Pump: DEBP;Personal  Consult Status Consult Status: Follow-up Date: 08/21/23 Follow-up type: In-patient    Dema Severin BS, IBCLC 08/20/2023, 10:39 PM

## 2023-08-21 ENCOUNTER — Other Ambulatory Visit (HOSPITAL_COMMUNITY): Payer: Self-pay

## 2023-08-21 MED ORDER — IBUPROFEN 600 MG PO TABS
600.0000 mg | ORAL_TABLET | Freq: Four times a day (QID) | ORAL | 0 refills | Status: AC
  Filled 2023-08-21: qty 30, 8d supply, fill #0

## 2023-08-21 MED ORDER — SENNOSIDES-DOCUSATE SODIUM 8.6-50 MG PO TABS
2.0000 | ORAL_TABLET | Freq: Two times a day (BID) | ORAL | 0 refills | Status: AC | PRN
  Filled 2023-08-21: qty 30, 8d supply, fill #0

## 2023-08-21 MED ORDER — FERROUS SULFATE 325 (65 FE) MG PO TABS
325.0000 mg | ORAL_TABLET | ORAL | Status: DC
  Filled 2023-08-21: qty 1

## 2023-08-21 MED ORDER — ACETAMINOPHEN 325 MG PO TABS
650.0000 mg | ORAL_TABLET | ORAL | 0 refills | Status: AC | PRN
  Filled 2023-08-21: qty 30, 3d supply, fill #0

## 2023-08-21 MED ORDER — FERROUS SULFATE 325 (65 FE) MG PO TABS
325.0000 mg | ORAL_TABLET | ORAL | 3 refills | Status: AC
  Filled 2023-08-21: qty 30, 60d supply, fill #0

## 2023-08-21 NOTE — Lactation Note (Signed)
 This note was copied from a baby's chart. Lactation Consultation Note  Patient Name: Boy Haizel Gatchell [id number] Date: 08/21/2023 Age:25 hoursm P1  Reason for consult: Follow-up assessment;Primapara;1st time breastfeeding;Early term 37-38.6wks;Infant weight loss;Mother's request;Difficult latch (2 % weight loss, SLP) SLP has been working with baby and per mom the purple nipple is working well. Baby last fed at 7:30 am 17 ml.  Baby awake , LC offered to check the diaper and change it. Mom receptive , Large wet.  Baby placed STS on the right breast, football, and mom had good positioning with hands and arm. Baby awake for short time and didn't seem interested in feeding.  LC reassured mom the baby probably wasn't to hungry, due to recently feeding.  LC recommended to allow the baby to get hungry and by 10 :30 am needed to eat.   Dyad is on the D/C list today - LC reviewed Breast feeding D/C teaching ( engorgement prevention and tx and recommended and LC O/P , mom receptive.  LC placed a request for [facility] O/P LC to call this mom.  LC reviewed supply and demand, importance of consistent pumping if the baby wasn't latching to establish the milk supply.  Mom aware the per feedings volumes need to increase by 48 hours to at least 30 ml and by the end of the week if the baby is still not latching to 45 or greater per feeding.   Maternal Data Has patient been taught Hand Expression?: Yes  Feeding Mother's Current Feeding Choice: Breast Milk and Formula Nipple Type: Nfant Standard Flow (white)  LATCH Score Latch: Too sleepy or reluctant, no latch achieved, no sucking elicited.  Audible Swallowing: None  Type of Nipple: Everted at rest and after stimulation (some areola edema and LC showed mom the reverse pressure)  Comfort - per mom comfortable  Hold : - LC assisted  Latch score 5    Lactation Tools Discussed/Used Tools: Pump;Flanges Flange Size: 21;24 Breast pump type:  Manual;Double-Electric Breast Pump Pump Education: Setup, frequency, and cleaning;Other (comment);Milk Storage (per mom pumped x 3 in the last 24 hours. LC encouraged to increase pumping and stressed the importance of consistency) Reason for Pumping: DL , Pumped volume: 2 mL  Interventions Interventions: Breast feeding basics reviewed;Assisted with latch;Skin to skin;Breast massage;Hand express;Pre-pump if needed;Reverse pressure;Breast compression;Adjust position;Support pillows;Position options;Hand pump;DEBP;Education;LC Services brochure;CDC milk storage guidelines;CDC Guidelines for Breast Pump Cleaning  Discharge Discharge Education: Engorgement and breast care;Warning signs for feeding baby;Outpatient recommendation Pump: Personal;DEBP  Consult Status Consult Status: Follow-up Date: 08/21/23 Follow-up type: In-patient    Matilde Sprang Penny Frisbie 08/21/2023, 8:41 AM

## 2023-08-21 NOTE — Progress Notes (Signed)
 CSW received a consult for MOB needing a carseat. CSW met MOB at bedside to offer support. CSW entered the room, introduced herself and explained the reason for the visit. MOB was polite, easy to engage, receptive to meeting with CSW, and appeared forthcoming.  CSW asked MOB if a carseat is still needed and the $30 cost associated. MOB reported that she is still in need of a carseat and she is waiting for her mom to bring the money to purchase the carseat.  CSW delivered the carseat to MOB at bedside.  CSW identifies no further need for intervention and no barriers to discharge at this time.  Enos Fling, Theresia Majors Clinical Social Worker [phone]

## 2023-08-21 NOTE — Patient Instructions (Signed)
 Your appointment with Outpatient Lactation is: April 17th at 8:30am [facility] (First Floor) [address] [place]  Check in under baby's name.  Please bring your baby hungry along with your pump and a bottle of either formula or expressed breast milk. Please also bring your pump flanges and we welcome support people! If you need lactation assistance before your appointment, please call [phone] and press 4 for lactation.

## 2023-08-21 NOTE — Lactation Note (Addendum)
 This note was copied from a baby's chart. Lactation Consultation Note  Patient Name: Susan Hammond [id number] Date: 08/21/2023 Age:25 hours Reason for consult: Follow-up assessment (baby has been cleared for discharge today) See previous note  Social Worker informed LC mom had questions about [employer] DEBP and requested LC see mom prior to D/C.  LC asked mom about her DEBP and she mentioned she had ordered her DEBP and [employer] is delayed.  LC informed mom her Medicaid type is not accepted by [facility].  Per mom [facility] [place] called her and recommended [employer] or [employer] she could order her DEBP.  With clarification mom doesn't need to order a DEBP  she as one on the way. LC recommended calling back [facility] today for clarification of the [facility] loaner.  Glodean with Family contacts will F/U with an assigned case worker with this patient because mom signed with them yesterday.   Maternal Data    Feeding Mother's Current Feeding Choice: Breast Milk and Formula Nipple Type: Nfant Slow Flow (purple)   Discharge  See previous LC note this am  Consult Status Consult Status: Complete    Susan Hammond 08/21/2023, 1:10 PM

## 2023-08-23 ENCOUNTER — Encounter: Admitting: Physician Assistant

## 2023-08-29 ENCOUNTER — Telehealth (HOSPITAL_COMMUNITY): Payer: Self-pay | Admitting: *Deleted

## 2023-08-29 NOTE — Telephone Encounter (Signed)
 08/29/2023  Name: Susan Hammond MRN: [id number] DOB: [birth year]  Reason for Call:  Transition of Care Hospital Discharge Call  Contact Status: Patient Contact Status: Complete  Language assistant needed:          Follow-Up Questions: Do You Have Any Concerns About Your Health As You Heal From Delivery?: Yes What Concerns Do You Have About Your Health?: Patient stated, "My feet are swollen." With further questioning, patient reported that her feet have been swollen since delivery, and that swelling is decreasing. RN reviewed signs of preeclampsia to report to MD. Instructed patient to call MD and/or come to MAU if symptoms arise. Patient denied symptoms at this time. Patient verbalized understanding. Do You Have Any Concerns About Your Infants Health?: Yes What Concerns Do You Have About Your Baby?: Patient stated, "Her umbilical cord fell off last night. Is that normal?" RN reassured patient. No warning signs reported. No other questions or concerns voiced at this time.  Edinburgh Postnatal Depression Scale:  In the Past 7 Days:   Declined. Stated, "My answers are the same. And I have great support." PHQ2-9 Depression Scale:     Discharge Follow-up: Edinburgh score requires follow up?: N/A Patient was advised of the following resources:: Breastfeeding Support Group, Support Group  Post-discharge interventions: Reviewed Newborn Safe Sleep Practices  Signature Deforest Hoyles, RN, 08/29/23, [phone]

## 2023-09-02 ENCOUNTER — Other Ambulatory Visit: Payer: Self-pay

## 2023-09-02 ENCOUNTER — Ambulatory Visit (INDEPENDENT_AMBULATORY_CARE_PROVIDER_SITE_OTHER): Admitting: Student

## 2023-09-02 ENCOUNTER — Emergency Department (HOSPITAL_COMMUNITY)
Admission: EM | Admit: 2023-09-02 | Discharge: 2023-09-02 | Discharge status: Against Medical Advice | Attending: Emergency Medicine | Admitting: Emergency Medicine

## 2023-09-02 ENCOUNTER — Encounter (HOSPITAL_COMMUNITY): Payer: Self-pay

## 2023-09-02 VITALS — BP 121/80 | HR 84 | Temp 99.1°F | Ht 62.0 in | Wt 149.4 lb

## 2023-09-02 DIAGNOSIS — M7918 Myalgia, other site: Secondary | ICD-10-CM | POA: Diagnosis not present

## 2023-09-02 DIAGNOSIS — R600 Localized edema: Secondary | ICD-10-CM | POA: Insufficient documentation

## 2023-09-02 DIAGNOSIS — N63 Unspecified lump in unspecified breast: Secondary | ICD-10-CM | POA: Diagnosis present

## 2023-09-02 DIAGNOSIS — N6310 Unspecified lump in the right breast, unspecified quadrant: Secondary | ICD-10-CM

## 2023-09-02 DIAGNOSIS — R6883 Chills (without fever): Secondary | ICD-10-CM | POA: Diagnosis not present

## 2023-09-02 DIAGNOSIS — N644 Mastodynia: Secondary | ICD-10-CM | POA: Diagnosis not present

## 2023-09-02 DIAGNOSIS — Z5321 Procedure and treatment not carried out due to patient leaving prior to being seen by health care provider: Secondary | ICD-10-CM | POA: Diagnosis not present

## 2023-09-02 LAB — BASIC METABOLIC PANEL WITH GFR
Anion gap: 10 (ref 5–15)
BUN: 8 mg/dL (ref 6–20)
CO2: 22 mmol/L (ref 22–32)
Calcium: 8.8 mg/dL — ABNORMAL LOW (ref 8.9–10.3)
Chloride: 107 mmol/L (ref 98–111)
Creatinine, Ser: 0.76 mg/dL (ref 0.44–1.00)
GFR, Estimated: >60 mL/min (ref >60)
Glucose, Bld: 80 mg/dL (ref 70–99)
Potassium: 3.4 mmol/L — ABNORMAL LOW (ref 3.5–5.1)
Sodium: 139 mmol/L (ref 135–145)

## 2023-09-02 LAB — CBC
HCT: 36.9 % (ref 36.0–46.0)
Hemoglobin: 10.7 g/dL — ABNORMAL LOW (ref 12.0–15.0)
MCH: 21.8 pg — ABNORMAL LOW (ref 26.0–34.0)
MCHC: 29 g/dL — ABNORMAL LOW (ref 30.0–36.0)
MCV: 75.3 fL — ABNORMAL LOW (ref 80.0–100.0)
Platelets: 347 10*3/uL (ref 150–400)
RBC: 4.9 MIL/uL (ref 3.87–5.11)
RDW: 22.2 % — ABNORMAL HIGH (ref 11.5–15.5)
WBC: 11.3 10*3/uL — ABNORMAL HIGH (ref 4.0–10.5)
nRBC: 0 % (ref 0.0–0.2)

## 2023-09-02 LAB — URINALYSIS, ROUTINE W REFLEX MICROSCOPIC
Bilirubin Urine: NEGATIVE
Glucose, UA: NEGATIVE mg/dL
Ketones, ur: NEGATIVE mg/dL
Nitrite: NEGATIVE
Protein, ur: NEGATIVE mg/dL
Specific Gravity, Urine: 1.012 (ref 1.005–1.030)
WBC, UA: >50 WBC/hpf (ref 0–5)
pH: 6 (ref 5.0–8.0)

## 2023-09-02 NOTE — ED Notes (Signed)
 No resp for vitals after 20+ mins - multiple attempts

## 2023-09-02 NOTE — ED Provider Triage Note (Signed)
 Emergency Medicine Provider Triage Evaluation Note  Susan Hammond , a 23 y.o. female  was evaluated in triage.  Pt complains of R breast mass and pain. Vaginal delivery 3/31, states she is pumping currently. No fevers at home, is endorsing body aches and chills. No drainage from nipple. Also concerned about lower extremity swelling and edema.   Review of Systems  Positive:  Negative:   Physical Exam  BP 136/89   Pulse 76   Temp 99.2 F (37.3 C) (Oral)   Resp 17   LMP 11/22/2022   SpO2 98%  Gen:   Awake, no distress   Resp:  Normal effort  MSK:   Moves extremities without difficulty  Other:  No erythema, fluctuance. Induration present.  Medical Decision Making  Medically screening exam initiated at 4:50 PM.  Appropriate orders placed.  Susan Hammond was informed that the remainder of the evaluation will be completed by another provider, this initial triage assessment does not replace that evaluation, and the importance of remaining in the ED until their evaluation is complete.     Adel Aden, PA-C 09/02/23 1652

## 2023-09-02 NOTE — Assessment & Plan Note (Signed)
 Large 9cm x 9cm firm nodule in right breast, most concerning for breast abscess given significant growth over 4 days (from patient report) and systemic chills, general malaise/feeling of unwellness. Consider mastitis, but localized well-circumscribed area not as consistent with mastitis Patient agreeable to go to ER from clinic for STAT breast ultrasound/imaging, possible I&D if findings consistent with abscess and need for IV antibiotics.

## 2023-09-02 NOTE — Progress Notes (Signed)
    SUBJECTIVE:   CHIEF COMPLAINT / HPI:   Susan Hammond is a 23 year-old female here with breast swelling and generalized malaise/bodyaches.  She had a vaginal delivery on 08/19/2023, uncomplicated other than 2nd degree lac. She is pumping breastmilk, feels a little bit better after she pumps Noticed R breast swelling 4-5 days ago. Started out kind of small, but significantly enlarged over past few days. Had a breast biopsy 2 years ago in R breast, benign results.  Last night, started to have chills, bodyaches. Also reporting a low-grade fever. She has had a headache as well.  No bleeding from nipple. Having leakage of breast milk from the nipple.  PERTINENT  PMH / PSH:   OBJECTIVE:   BP 121/80   Pulse 84   Temp 99.1 F (37.3 C)   Ht 5\' 2"  (1.575 m)   Wt 149 lb 6.4 oz (67.8 kg)   LMP 11/22/2022   SpO2 97%   BMI 27.33 kg/m   General: Pleasant, well-appearing Breast exam: Pendulous breasts, right breast with 9 cm x 9 cm area of well-circumscribed firmness in upper inner and lower inner quadrants with mild overlying erythema. Not exquisitely tender to palpation. No notable nipple drainage, bleeding or inversion bilaterally. Right breast with no palpable nodules.   ASSESSMENT/PLAN:   Breast lump Large 9cm x 9cm firm nodule in right breast, most concerning for breast abscess given significant growth over 4 days (from patient report) and systemic chills, general malaise/feeling of unwellness. Consider mastitis, but localized well-circumscribed area not as consistent with mastitis Patient agreeable to go to ER from clinic for STAT breast ultrasound/imaging, possible I&D if findings consistent with abscess and need for IV antibiotics.     Lashawn Bromwell, DO [facility]

## 2023-09-02 NOTE — ED Triage Notes (Signed)
 Patienet sent by UC for mass on right breast.  Patient is 14 days post partum.  Reports having chills and body aches.  Patient also complains of bilateral leg swelling.

## 2023-09-02 NOTE — Patient Instructions (Signed)
 So lovely meeting you today. I'm glad you came in to see us .  Please go to the Emergency Department for your breast swelling- I am concerned you have an abscess that needs STAT ultrasound imaging with possible incision/drainage and antibiotics.  They will take great care of you! We will see you back in the clinic next week for follow up.   Dr. Larrell Rapozo

## 2023-09-03 ENCOUNTER — Telehealth: Payer: Self-pay | Admitting: Advanced Practice Midwife

## 2023-09-03 ENCOUNTER — Telehealth: Payer: Self-pay

## 2023-09-03 ENCOUNTER — Ambulatory Visit

## 2023-09-03 DIAGNOSIS — N6310 Unspecified lump in the right breast, unspecified quadrant: Secondary | ICD-10-CM

## 2023-09-03 DIAGNOSIS — O9123 Nonpurulent mastitis associated with lactation: Secondary | ICD-10-CM | POA: Insufficient documentation

## 2023-09-03 MED ORDER — CEFADROXIL 500 MG PO CAPS: 500.0000 mg | ORAL_CAPSULE | Freq: Two times a day (BID) | ORAL | 0 refills | Status: AC

## 2023-09-03 NOTE — Telephone Encounter (Signed)
 Patient returned call to nurse line. She reports that she attempted to call her OBGYN, however, they were unable to see her today. Patient is unsure how she is to proceed.   Contacted Dr. Dameron regarding concern. She advised scheduling patient appointment today for follow up to determine direct admission vs. Oral abx with STAT imaging.   Scheduled patient in RN triage spot for this afternoon.   Patient appreciative.   Elsie Halo, RN

## 2023-09-03 NOTE — Telephone Encounter (Signed)
 I called patient to return her phone call to the office today.  I verified pt identity using 2 markers.  Susan Hammond is a G1P1, who is 2 weeks postpartum following vaginal delivery with pain and a lump in her right breast. She had associated low grade fever, as high as 100.2 at home yesterday that resolved. She also reports headache x 2 days, that resolved today.  She has hx of biopsy with benign findings in the same right breast.  She saw her PCP today and was directed to the ED but did not have childcare for the wait there.  I recommended ice to the breast and NSAIDs, frequently as a first line of treatment.  Abx are not always recommended for mastitis due to current recommendations, as it is an inflammatory process, but given pt hx of breast mass, low grade fever, and no immediate appointment available for office visit, will send Rx for Duricef BID x 7 days.  Pt has lactation appt in 48 hours and encouraged to keep appt and take breast pump (she is exclusively pumping which is going well).  I ordered a breast ultrasound to evaluate the mass, since she has hx of biopsy in the same area.  Pt to f/u in our office this week or next.  If symptoms worsen, pt to go to MAU for evaluation.  Pt states understanding and agrees with plan of care today.

## 2023-09-03 NOTE — Telephone Encounter (Signed)
 Patient calls nurse line requesting STAT US  order.   She reports she went to the emergency department as instructed, however had to leave due to child care issues.   She reports she is feeling better today. She denies any pain or fevers. No chills or body aches.   Discussed strict ED precautions.   Will forward to provider who saw patient for concern.

## 2023-09-05 ENCOUNTER — Ambulatory Visit

## 2023-09-09 ENCOUNTER — Other Ambulatory Visit

## 2023-09-16 ENCOUNTER — Ambulatory Visit: Admitting: Advanced Practice Midwife

## 2023-09-17 ENCOUNTER — Ambulatory Visit
Admission: RE | Admit: 2023-09-17 | Discharge: 2023-09-17 | Discharge status: Home / Self Care | Source: Ambulatory Visit | Attending: Advanced Practice Midwife | Admitting: Advanced Practice Midwife

## 2023-09-17 ENCOUNTER — Other Ambulatory Visit: Payer: Self-pay | Admitting: Advanced Practice Midwife

## 2023-09-17 DIAGNOSIS — N6310 Unspecified lump in the right breast, unspecified quadrant: Secondary | ICD-10-CM

## 2023-09-17 DIAGNOSIS — O9123 Nonpurulent mastitis associated with lactation: Secondary | ICD-10-CM

## 2023-09-17 DIAGNOSIS — N6314 Unspecified lump in the right breast, lower inner quadrant: Secondary | ICD-10-CM | POA: Diagnosis not present

## 2023-09-18 ENCOUNTER — Other Ambulatory Visit

## 2023-09-19 ENCOUNTER — Ambulatory Visit: Admitting: Physician Assistant

## 2023-10-02 ENCOUNTER — Telehealth: Payer: Self-pay

## 2023-10-02 MED ORDER — NYSTATIN 100000 UNIT/GM EX CREA: TOPICAL_CREAM | CUTANEOUS | 0 refills | Status: DC

## 2023-10-02 NOTE — Telephone Encounter (Signed)
 Returned call, pt stated that baby has oral thrush and Peds has prescribed treatment and she is requesting treatment for nipples, sent rx per protocol.

## 2023-10-16 ENCOUNTER — Ambulatory Visit (INDEPENDENT_AMBULATORY_CARE_PROVIDER_SITE_OTHER): Admitting: Obstetrics and Gynecology

## 2023-10-16 DIAGNOSIS — K5900 Constipation, unspecified: Secondary | ICD-10-CM | POA: Diagnosis not present

## 2023-10-16 MED ORDER — DOCUSATE SODIUM 100 MG PO CAPS: 100.0000 mg | ORAL_CAPSULE | Freq: Two times a day (BID) | ORAL | 0 refills | Status: AC

## 2023-10-16 MED ORDER — SIMETHICONE 80 MG PO CHEW: 80.0000 mg | CHEWABLE_TABLET | Freq: Four times a day (QID) | ORAL | 0 refills | Status: AC | PRN

## 2023-10-16 NOTE — Progress Notes (Unsigned)
 Pt presents for pp. Pt would like the pill for bc. Pt is having some bleeding after bowel movement.

## 2023-10-16 NOTE — Progress Notes (Unsigned)
 Post Partum Visit Note  Susan Hammond is a 23 y.o. G40P1001 female who presents for a postpartum visit. She is 8 weeks postpartum following a normal spontaneous vaginal delivery.  I have fully reviewed the prenatal and intrapartum course. The delivery was at 38 gestational weeks.  Anesthesia: epidural. Postpartum course has been good, currently has constipation. Baby is doing well yes. Baby is feeding by breast. Bleeding no bleeding. Bowel function is abnormal: bleeding after. Bladder function is normal. Patient is not sexually active. Contraception method is none. Postpartum depression screening: negative.   The pregnancy intention screening data noted above was reviewed. Potential methods of contraception were discussed. The patient elected to proceed with No data recorded.   Edinburgh Postnatal Depression Scale - 10/16/23 1628       Edinburgh Postnatal Depression Scale:  In the Past 7 Days   I have been able to laugh and see the funny side of things. 0    I have looked forward with enjoyment to things. 0    I have blamed myself unnecessarily when things went wrong. 0    I have been anxious or worried for no good reason. 0    I have felt scared or panicky for no good reason. 0    Things have been getting on top of me. 0    I have been so unhappy that I have had difficulty sleeping. 0    I have felt sad or miserable. 0    I have been so unhappy that I have been crying. 0    The thought of harming myself has occurred to me. 0    Edinburgh Postnatal Depression Scale Total 0             Health Maintenance Due  Topic Date Due   HPV VACCINES (1 - 3-dose series) Never done   COVID-19 Vaccine (1 - 2024-25 season) Never done    The following portions of the patient's history were reviewed and updated as appropriate: allergies, current medications, past family history, past medical history, past social history, past surgical history, and problem list.  Review of  Systems Pertinent items are noted in HPI.  Objective:  BP 124/80   Pulse 69   Ht 5\' 2"  (1.575 m)   Wt 140 lb 9.6 oz (63.8 kg)   LMP 09/29/2023 (Approximate)   Breastfeeding Yes Comment: Both  BMI 25.72 kg/m    General:  alert and cooperative   Breasts:  Not indicated   Lungs: Normal effort      Abdomen:   Wound N/a   GU exam:  {desc; normal/abnormal/not indicated:14647}       Assessment:    There are no diagnoses linked to this encounter.  *** postpartum exam.   Plan:   Essential components of care per ACOG recommendations:  1.  Mood and well being: Patient with negative depression screening today. Reviewed local resources for support.  - Patient tobacco use? No.   - hx of drug use? No.    2. Infant care and feeding:  -Patient currently breastmilk feeding? Yes. Reviewed importance of draining breast regularly to support lactation.  -Social determinants of health (SDOH) reviewed in EPIC. No concerns  3. Sexuality, contraception and birth spacing - Patient does not want a pregnancy in the next year.  Desired family size is unsure children.  - Reviewed reproductive life planning. Reviewed contraceptive methods based on pt preferences and effectiveness.  Patient desired Abstinence today.   - Discussed  birth spacing of 18 months  4. Sleep and fatigue -Encouraged family/partner/community support of 4 hrs of uninterrupted sleep to help with mood and fatigue  5. Physical Recovery  - Discussed patients delivery and complications. She describes her labor as good. - Patient had a Vaginal, no problems at delivery. Patient had a 2nd degree laceration. Perineal healing reviewed. Patient expressed understanding - Patient has urinary incontinence? No. - Patient is safe to resume physical and sexual activity  6.  Health Maintenance - HM due items addressed Yes - Last pap smear  Diagnosis  Date Value Ref Range Status  03/19/2023   Final   - Negative for intraepithelial lesion  or malignancy (NILM)   Pap smear not done at today's visit.  -Breast Cancer screening indicated?following with breast u/s d/t fibroadenoma   7. Chronic Disease/Pregnancy Condition follow up: {Follow up:25499}  - PCP follow up  Susi Eric, FNP  [facility], [facility]

## 2023-11-29 ENCOUNTER — Other Ambulatory Visit: Payer: Self-pay | Admitting: Obstetrics and Gynecology

## 2023-12-19 ENCOUNTER — Ambulatory Visit
Admission: RE | Admit: 2023-12-19 | Discharge: 2023-12-19 | Disposition: A | Discharge status: Home / Self Care | Source: Ambulatory Visit | Attending: Advanced Practice Midwife | Admitting: Advanced Practice Midwife

## 2023-12-19 ENCOUNTER — Other Ambulatory Visit: Payer: Self-pay | Admitting: Advanced Practice Midwife

## 2023-12-19 DIAGNOSIS — N6310 Unspecified lump in the right breast, unspecified quadrant: Secondary | ICD-10-CM

## 2023-12-19 DIAGNOSIS — N6314 Unspecified lump in the right breast, lower inner quadrant: Secondary | ICD-10-CM | POA: Diagnosis not present

## 2023-12-21 ENCOUNTER — Ambulatory Visit: Payer: Self-pay | Admitting: Advanced Practice Midwife

## 2024-02-05 DIAGNOSIS — H5213 Myopia, bilateral: Secondary | ICD-10-CM | POA: Diagnosis not present

## 2024-03-23 ENCOUNTER — Ambulatory Visit
Admission: RE | Admit: 2024-03-23 | Discharge: 2024-03-23 | Disposition: A | Discharge status: Home / Self Care | Source: Ambulatory Visit | Attending: Advanced Practice Midwife | Admitting: Advanced Practice Midwife

## 2024-03-23 DIAGNOSIS — N6310 Unspecified lump in the right breast, unspecified quadrant: Secondary | ICD-10-CM

## 2024-03-24 ENCOUNTER — Other Ambulatory Visit: Payer: Self-pay | Admitting: Advanced Practice Midwife

## 2024-03-24 DIAGNOSIS — R928 Other abnormal and inconclusive findings on diagnostic imaging of breast: Secondary | ICD-10-CM
